# Patient Record
Sex: Male | Born: 1965
Health system: Southern US, Community
[De-identification: ages and names within clinical notes are randomized; demographics above are authoritative.]

## PROBLEM LIST (undated history)

## (undated) DIAGNOSIS — Z86718 Personal history of other venous thrombosis and embolism: Secondary | ICD-10-CM

## (undated) DIAGNOSIS — C801 Malignant (primary) neoplasm, unspecified: Secondary | ICD-10-CM

## (undated) DIAGNOSIS — Z7901 Long term (current) use of anticoagulants: Secondary | ICD-10-CM

## (undated) DIAGNOSIS — N5089 Other specified disorders of the male genital organs: Secondary | ICD-10-CM

## (undated) DIAGNOSIS — D6859 Other primary thrombophilia: Secondary | ICD-10-CM

## (undated) HISTORY — PX: KNEE ARTHROSCOPY W/ ACL RECONSTRUCTION: SHX1858

## (undated) HISTORY — DX: Other primary thrombophilia: D68.59

---

## 2001-05-09 ENCOUNTER — Encounter: Admission: RE | Admit: 2001-05-09 | Discharge: 2001-05-09 | Payer: Self-pay | Admitting: Family Medicine

## 2001-05-09 ENCOUNTER — Encounter: Payer: Self-pay | Admitting: Family Medicine

## 2001-05-09 ENCOUNTER — Inpatient Hospital Stay (HOSPITAL_COMMUNITY): Admission: EM | Admit: 2001-05-09 | Discharge: 2001-05-10 | Payer: Self-pay | Admitting: *Deleted

## 2001-05-14 ENCOUNTER — Encounter: Admission: RE | Admit: 2001-05-14 | Discharge: 2001-05-14 | Payer: Self-pay | Admitting: Internal Medicine

## 2001-05-21 ENCOUNTER — Encounter: Admission: RE | Admit: 2001-05-21 | Discharge: 2001-05-21 | Payer: Self-pay | Admitting: Internal Medicine

## 2001-06-04 ENCOUNTER — Encounter: Admission: RE | Admit: 2001-06-04 | Discharge: 2001-06-04 | Payer: Self-pay | Admitting: Internal Medicine

## 2001-06-06 ENCOUNTER — Ambulatory Visit (HOSPITAL_COMMUNITY): Admission: RE | Admit: 2001-06-06 | Discharge: 2001-06-06 | Payer: Self-pay | Admitting: Internal Medicine

## 2001-06-06 ENCOUNTER — Encounter: Admission: RE | Admit: 2001-06-06 | Discharge: 2001-06-06 | Payer: Self-pay

## 2001-06-11 ENCOUNTER — Encounter: Admission: RE | Admit: 2001-06-11 | Discharge: 2001-06-11 | Payer: Self-pay | Admitting: Internal Medicine

## 2001-06-28 ENCOUNTER — Encounter: Admission: RE | Admit: 2001-06-28 | Discharge: 2001-06-28 | Payer: Self-pay

## 2001-07-16 ENCOUNTER — Encounter: Admission: RE | Admit: 2001-07-16 | Discharge: 2001-07-16 | Payer: Self-pay

## 2001-08-06 ENCOUNTER — Encounter: Admission: RE | Admit: 2001-08-06 | Discharge: 2001-08-06 | Payer: Self-pay | Admitting: Internal Medicine

## 2001-08-27 ENCOUNTER — Encounter: Admission: RE | Admit: 2001-08-27 | Discharge: 2001-08-27 | Payer: Self-pay | Admitting: Internal Medicine

## 2001-10-29 ENCOUNTER — Encounter: Admission: RE | Admit: 2001-10-29 | Discharge: 2001-10-29 | Payer: Self-pay

## 2001-11-26 ENCOUNTER — Encounter: Admission: RE | Admit: 2001-11-26 | Discharge: 2001-11-26 | Payer: Self-pay | Admitting: Internal Medicine

## 2001-12-05 ENCOUNTER — Ambulatory Visit (HOSPITAL_BASED_OUTPATIENT_CLINIC_OR_DEPARTMENT_OTHER): Admission: RE | Admit: 2001-12-05 | Discharge: 2001-12-05 | Payer: Self-pay | Admitting: General Surgery

## 2001-12-05 ENCOUNTER — Encounter (INDEPENDENT_AMBULATORY_CARE_PROVIDER_SITE_OTHER): Payer: Self-pay | Admitting: *Deleted

## 2002-01-16 ENCOUNTER — Encounter: Admission: RE | Admit: 2002-01-16 | Discharge: 2002-01-16 | Payer: Self-pay | Admitting: Internal Medicine

## 2002-01-22 ENCOUNTER — Encounter: Admission: RE | Admit: 2002-01-22 | Discharge: 2002-01-22 | Payer: Self-pay | Admitting: Internal Medicine

## 2002-03-15 ENCOUNTER — Encounter: Admission: RE | Admit: 2002-03-15 | Discharge: 2002-03-15 | Payer: Self-pay | Admitting: Internal Medicine

## 2002-03-25 ENCOUNTER — Encounter: Admission: RE | Admit: 2002-03-25 | Discharge: 2002-03-25 | Payer: Self-pay | Admitting: Internal Medicine

## 2002-04-11 ENCOUNTER — Encounter: Admission: RE | Admit: 2002-04-11 | Discharge: 2002-04-11 | Payer: Self-pay | Admitting: Internal Medicine

## 2002-05-09 ENCOUNTER — Encounter: Admission: RE | Admit: 2002-05-09 | Discharge: 2002-05-09 | Payer: Self-pay | Admitting: Internal Medicine

## 2002-06-03 ENCOUNTER — Encounter: Admission: RE | Admit: 2002-06-03 | Discharge: 2002-06-03 | Payer: Self-pay | Admitting: Internal Medicine

## 2002-07-01 ENCOUNTER — Encounter: Admission: RE | Admit: 2002-07-01 | Discharge: 2002-07-01 | Payer: Self-pay | Admitting: Internal Medicine

## 2002-09-12 ENCOUNTER — Encounter: Admission: RE | Admit: 2002-09-12 | Discharge: 2002-09-12 | Payer: Self-pay | Admitting: Internal Medicine

## 2002-11-07 ENCOUNTER — Encounter: Admission: RE | Admit: 2002-11-07 | Discharge: 2002-11-07 | Payer: Self-pay | Admitting: Internal Medicine

## 2003-02-03 ENCOUNTER — Emergency Department (HOSPITAL_COMMUNITY): Admission: EM | Admit: 2003-02-03 | Discharge: 2003-02-03 | Payer: Self-pay | Admitting: Emergency Medicine

## 2003-06-18 ENCOUNTER — Encounter: Admission: RE | Admit: 2003-06-18 | Discharge: 2003-06-18 | Payer: Self-pay | Admitting: Internal Medicine

## 2003-06-18 ENCOUNTER — Ambulatory Visit (HOSPITAL_COMMUNITY): Admission: RE | Admit: 2003-06-18 | Discharge: 2003-06-18 | Payer: Self-pay | Admitting: Internal Medicine

## 2003-06-18 ENCOUNTER — Encounter: Payer: Self-pay | Admitting: Internal Medicine

## 2003-06-20 ENCOUNTER — Encounter: Admission: RE | Admit: 2003-06-20 | Discharge: 2003-06-20 | Payer: Self-pay | Admitting: Internal Medicine

## 2003-06-26 ENCOUNTER — Encounter: Admission: RE | Admit: 2003-06-26 | Discharge: 2003-06-26 | Payer: Self-pay | Admitting: Internal Medicine

## 2003-07-01 ENCOUNTER — Encounter: Admission: RE | Admit: 2003-07-01 | Discharge: 2003-07-01 | Payer: Self-pay | Admitting: Internal Medicine

## 2003-07-01 ENCOUNTER — Ambulatory Visit (HOSPITAL_COMMUNITY): Admission: RE | Admit: 2003-07-01 | Discharge: 2003-07-01 | Payer: Self-pay | Admitting: Orthopedic Surgery

## 2003-07-07 ENCOUNTER — Encounter: Admission: RE | Admit: 2003-07-07 | Discharge: 2003-07-07 | Payer: Self-pay | Admitting: Internal Medicine

## 2003-07-21 ENCOUNTER — Encounter: Admission: RE | Admit: 2003-07-21 | Discharge: 2003-07-21 | Payer: Self-pay | Admitting: Internal Medicine

## 2003-08-04 ENCOUNTER — Encounter: Admission: RE | Admit: 2003-08-04 | Discharge: 2003-08-04 | Payer: Self-pay | Admitting: Internal Medicine

## 2003-09-11 ENCOUNTER — Encounter: Admission: RE | Admit: 2003-09-11 | Discharge: 2003-09-11 | Payer: Self-pay | Admitting: Internal Medicine

## 2003-10-06 ENCOUNTER — Encounter: Admission: RE | Admit: 2003-10-06 | Discharge: 2003-10-06 | Payer: Self-pay | Admitting: Internal Medicine

## 2003-11-03 ENCOUNTER — Encounter: Admission: RE | Admit: 2003-11-03 | Discharge: 2003-11-03 | Payer: Self-pay | Admitting: Internal Medicine

## 2004-06-14 ENCOUNTER — Ambulatory Visit: Payer: Self-pay | Admitting: Internal Medicine

## 2004-07-12 ENCOUNTER — Ambulatory Visit: Payer: Self-pay | Admitting: Internal Medicine

## 2004-08-16 ENCOUNTER — Ambulatory Visit: Payer: Self-pay | Admitting: Internal Medicine

## 2004-10-18 ENCOUNTER — Ambulatory Visit: Payer: Self-pay | Admitting: Internal Medicine

## 2005-03-28 ENCOUNTER — Ambulatory Visit: Payer: Self-pay | Admitting: Internal Medicine

## 2005-05-23 ENCOUNTER — Ambulatory Visit: Payer: Self-pay | Admitting: Internal Medicine

## 2006-01-08 ENCOUNTER — Emergency Department (HOSPITAL_COMMUNITY): Admission: EM | Admit: 2006-01-08 | Discharge: 2006-01-08 | Payer: Self-pay | Admitting: Emergency Medicine

## 2006-01-12 ENCOUNTER — Ambulatory Visit: Payer: Self-pay | Admitting: Internal Medicine

## 2006-01-26 ENCOUNTER — Ambulatory Visit: Payer: Self-pay | Admitting: Internal Medicine

## 2006-04-24 ENCOUNTER — Ambulatory Visit: Payer: Self-pay | Admitting: Internal Medicine

## 2006-05-22 ENCOUNTER — Ambulatory Visit: Payer: Self-pay | Admitting: Internal Medicine

## 2006-06-26 ENCOUNTER — Ambulatory Visit: Payer: Self-pay | Admitting: Internal Medicine

## 2006-07-31 ENCOUNTER — Ambulatory Visit: Payer: Self-pay | Admitting: Internal Medicine

## 2006-09-11 ENCOUNTER — Ambulatory Visit: Payer: Self-pay | Admitting: Internal Medicine

## 2006-12-25 ENCOUNTER — Ambulatory Visit: Payer: Self-pay | Admitting: Hospitalist

## 2006-12-25 DIAGNOSIS — I82409 Acute embolism and thrombosis of unspecified deep veins of unspecified lower extremity: Secondary | ICD-10-CM | POA: Insufficient documentation

## 2006-12-25 LAB — CONVERTED CEMR LAB: INR: 3.8

## 2007-03-19 ENCOUNTER — Ambulatory Visit: Payer: Self-pay | Admitting: Hematology & Oncology

## 2007-03-26 ENCOUNTER — Ambulatory Visit: Payer: Self-pay | Admitting: *Deleted

## 2007-03-26 ENCOUNTER — Encounter (INDEPENDENT_AMBULATORY_CARE_PROVIDER_SITE_OTHER): Payer: Self-pay | Admitting: Internal Medicine

## 2007-03-26 LAB — CONVERTED CEMR LAB: INR: 2.7

## 2007-06-11 ENCOUNTER — Ambulatory Visit: Payer: Self-pay | Admitting: Hematology & Oncology

## 2007-06-19 LAB — HYPERCOAGULABLE PANEL, COMPREHENSIVE
Anticardiolipin IgA: 8 [APL'U] (ref <13)
Beta-2-Glycoprotein I IgM: <4 U/mL (ref <10)
Protein C Activity: 122 % (ref 75–133)
Protein C, Total: 87 % (ref 70–140)
Protein S Activity: 20 % — ABNORMAL LOW (ref 69–129)

## 2008-01-07 ENCOUNTER — Encounter: Payer: Self-pay | Admitting: Hematology & Oncology

## 2008-01-07 ENCOUNTER — Ambulatory Visit: Payer: Self-pay | Admitting: Surgery

## 2008-01-07 ENCOUNTER — Ambulatory Visit: Payer: Self-pay | Admitting: Hematology & Oncology

## 2008-01-07 ENCOUNTER — Ambulatory Visit (HOSPITAL_COMMUNITY): Admission: RE | Admit: 2008-01-07 | Discharge: 2008-01-07 | Payer: Self-pay | Admitting: Hematology & Oncology

## 2008-01-09 LAB — PROTEIN C ACTIVITY: Protein C Activity: 164 % — ABNORMAL HIGH (ref 75–133)

## 2008-01-09 LAB — PROTEIN C, TOTAL: Protein C, Total: 91 % (ref 70–140)

## 2008-02-13 LAB — PROTIME-INR
INR: 3.1 (ref 2.00–3.50)
Protime: 37.2 s — ABNORMAL HIGH (ref 10.6–13.4)

## 2008-02-21 LAB — PROTIME-INR: INR: 2.7 (ref 2.00–3.50)

## 2008-03-04 ENCOUNTER — Ambulatory Visit: Payer: Self-pay | Admitting: Hematology & Oncology

## 2008-03-06 LAB — PROTIME-INR

## 2008-03-18 ENCOUNTER — Ambulatory Visit: Payer: Self-pay | Admitting: Hematology & Oncology

## 2008-03-20 LAB — PROTIME-INR
INR: 2.7 (ref 2.00–3.50)
Protime: 32.4 Seconds — ABNORMAL HIGH (ref 10.6–13.4)

## 2008-04-03 LAB — PROTIME-INR
INR: 2.6 (ref 2.00–3.50)
Protime: 31.2 Seconds — ABNORMAL HIGH (ref 10.6–13.4)

## 2008-05-09 ENCOUNTER — Ambulatory Visit: Payer: Self-pay | Admitting: Hematology & Oncology

## 2008-05-09 LAB — PROTIME-INR

## 2008-06-11 ENCOUNTER — Ambulatory Visit: Payer: Self-pay | Admitting: Hematology & Oncology

## 2008-06-12 LAB — PROTIME-INR (CHCC SATELLITE): INR: 2.6 (ref 2.0–3.5)

## 2008-07-07 ENCOUNTER — Ambulatory Visit: Payer: Self-pay | Admitting: Hematology & Oncology

## 2008-07-09 LAB — PROTIME-INR: INR: 2.6 (ref 2.00–3.50)

## 2008-08-06 LAB — PROTIME-INR
INR: 2.5 (ref 2.00–3.50)
Protime: 30 Seconds — ABNORMAL HIGH (ref 10.6–13.4)

## 2008-09-01 ENCOUNTER — Ambulatory Visit: Payer: Self-pay | Admitting: Hematology & Oncology

## 2008-09-03 LAB — PROTIME-INR: Protime: 30 Seconds — ABNORMAL HIGH (ref 10.6–13.4)

## 2008-09-30 ENCOUNTER — Ambulatory Visit: Payer: Self-pay | Admitting: Hematology & Oncology

## 2008-10-01 LAB — PROTIME-INR (CHCC SATELLITE)

## 2008-10-27 ENCOUNTER — Ambulatory Visit: Payer: Self-pay | Admitting: Hematology & Oncology

## 2008-10-29 LAB — PROTIME-INR
INR: 3.1 (ref 2.00–3.50)
Protime: 37.2 Seconds — ABNORMAL HIGH (ref 10.6–13.4)

## 2008-12-22 ENCOUNTER — Ambulatory Visit: Payer: Self-pay | Admitting: Hematology & Oncology

## 2008-12-24 LAB — PROTIME-INR
INR: 2.9 (ref 2.00–3.50)
Protime: 34.8 Seconds — ABNORMAL HIGH (ref 10.6–13.4)

## 2009-01-26 LAB — PROTIME-INR: Protime: 34.8 Seconds — ABNORMAL HIGH (ref 10.6–13.4)

## 2009-01-27 ENCOUNTER — Ambulatory Visit: Payer: Self-pay | Admitting: Hematology & Oncology

## 2009-03-02 ENCOUNTER — Ambulatory Visit: Payer: Self-pay | Admitting: Hematology & Oncology

## 2009-04-06 ENCOUNTER — Ambulatory Visit: Payer: Self-pay | Admitting: Hematology & Oncology

## 2009-04-16 LAB — PROTIME-INR: Protime: 36 Seconds — ABNORMAL HIGH (ref 10.6–13.4)

## 2009-05-12 ENCOUNTER — Ambulatory Visit: Payer: Self-pay | Admitting: Hematology & Oncology

## 2009-05-13 LAB — PROTIME-INR (CHCC SATELLITE): Protime: 39.6 Seconds — ABNORMAL HIGH (ref 10.6–13.4)

## 2009-06-15 ENCOUNTER — Ambulatory Visit: Payer: Self-pay | Admitting: Hematology & Oncology

## 2009-06-30 LAB — PROTIME-INR

## 2009-07-20 ENCOUNTER — Ambulatory Visit: Payer: Self-pay | Admitting: Hematology

## 2009-07-22 LAB — PROTIME-INR: INR: 3.1 (ref 2.00–3.50)

## 2009-08-25 ENCOUNTER — Ambulatory Visit: Payer: Self-pay | Admitting: Hematology & Oncology

## 2009-08-26 LAB — CBC WITH DIFFERENTIAL (CANCER CENTER ONLY)
BASO%: 0.6 % (ref 0.0–2.0)
EOS%: 2.8 % (ref 0.0–7.0)
LYMPH%: 36.1 % (ref 14.0–48.0)
MCH: 30 pg (ref 28.0–33.4)
MCHC: 35.1 g/dL (ref 32.0–35.9)
MCV: 86 fL (ref 82–98)
MONO%: 8.3 % (ref 0.0–13.0)
Platelets: 211 10*3/uL (ref 145–400)
RDW: 12.2 % (ref 10.5–14.6)
WBC: 5.1 10*3/uL (ref 4.0–10.0)

## 2009-08-26 LAB — PROTIME-INR (CHCC SATELLITE)
INR: 2.5 (ref 2.0–3.5)
Protime: 30 s — ABNORMAL HIGH (ref 10.6–13.4)

## 2009-10-12 ENCOUNTER — Ambulatory Visit: Payer: Self-pay | Admitting: Hematology

## 2009-11-02 LAB — PROTIME-INR: INR: 2.6 (ref 2.00–3.50)

## 2009-12-16 ENCOUNTER — Encounter: Admission: RE | Admit: 2009-12-16 | Discharge: 2009-12-16 | Payer: Self-pay | Admitting: Family Medicine

## 2009-12-22 ENCOUNTER — Ambulatory Visit: Payer: Self-pay | Admitting: Hematology & Oncology

## 2009-12-30 LAB — CBC WITH DIFFERENTIAL (CANCER CENTER ONLY)
BASO#: 0 10*3/uL (ref 0.0–0.2)
BASO%: 0.6 % (ref 0.0–2.0)
HCT: 44 % (ref 38.7–49.9)
HGB: 15 g/dL (ref 13.0–17.1)
LYMPH#: 2.2 10*3/uL (ref 0.9–3.3)
LYMPH%: 34.6 % (ref 14.0–48.0)
MCHC: 34.1 g/dL (ref 32.0–35.9)
MCV: 88 fL (ref 82–98)
MONO#: 0.4 10*3/uL (ref 0.1–0.9)
NEUT%: 56.3 % (ref 40.0–80.0)
RDW: 11.7 % (ref 10.5–14.6)
WBC: 6.2 10*3/uL (ref 4.0–10.0)

## 2009-12-30 LAB — D-DIMER, QUANTITATIVE: D-Dimer, Quant: 0.22 ug/mL-FEU (ref 0.00–0.48)

## 2009-12-30 LAB — PROTIME-INR (CHCC SATELLITE)

## 2010-05-04 ENCOUNTER — Ambulatory Visit: Payer: Self-pay | Admitting: Hematology & Oncology

## 2010-05-05 LAB — CBC WITH DIFFERENTIAL (CANCER CENTER ONLY)
BASO#: 0.1 10*3/uL (ref 0.0–0.2)
BASO%: 1.5 % (ref 0.0–2.0)
EOS%: 2.6 % (ref 0.0–7.0)
Eosinophils Absolute: 0.2 10*3/uL (ref 0.0–0.5)
HCT: 45.5 % (ref 38.7–49.9)
HGB: 15.4 g/dL (ref 13.0–17.1)
LYMPH#: 2.2 10*3/uL (ref 0.9–3.3)
LYMPH%: 37 % (ref 14.0–48.0)
MCH: 29.9 pg (ref 28.0–33.4)
MCHC: 33.8 g/dL (ref 32.0–35.9)
MCV: 89 fL (ref 82–98)
MONO#: 0.4 10*3/uL (ref 0.1–0.9)
MONO%: 7.1 % (ref 0.0–13.0)
NEUT#: 3.1 10*3/uL (ref 1.5–6.5)
NEUT%: 51.8 % (ref 40.0–80.0)
Platelets: 236 10*3/uL (ref 145–400)
RBC: 5.13 10*6/uL (ref 4.20–5.70)
RDW: 11.9 % (ref 10.5–14.6)
WBC: 6 10*3/uL (ref 4.0–10.0)

## 2010-05-05 LAB — TECHNOLOGIST REVIEW CHCC SATELLITE: Tech Review: 2

## 2010-05-05 LAB — PROTIME-INR (CHCC SATELLITE)
INR: 3 (ref 2.0–3.5)
Protime: 36 Seconds — ABNORMAL HIGH (ref 10.6–13.4)

## 2010-06-15 ENCOUNTER — Ambulatory Visit: Payer: Self-pay | Admitting: Oncology

## 2010-07-26 ENCOUNTER — Ambulatory Visit: Payer: Self-pay | Admitting: Oncology

## 2010-07-26 LAB — PROTIME-INR: Protime: 25.2 Seconds — ABNORMAL HIGH (ref 10.6–13.4)

## 2010-09-15 ENCOUNTER — Ambulatory Visit (HOSPITAL_BASED_OUTPATIENT_CLINIC_OR_DEPARTMENT_OTHER): Payer: BC Managed Care – PPO | Admitting: Hematology & Oncology

## 2010-11-03 ENCOUNTER — Encounter (HOSPITAL_BASED_OUTPATIENT_CLINIC_OR_DEPARTMENT_OTHER): Payer: BC Managed Care – PPO | Admitting: Hematology & Oncology

## 2010-11-03 DIAGNOSIS — D6859 Other primary thrombophilia: Secondary | ICD-10-CM

## 2010-11-03 DIAGNOSIS — Z7901 Long term (current) use of anticoagulants: Secondary | ICD-10-CM

## 2010-11-03 DIAGNOSIS — Z86718 Personal history of other venous thrombosis and embolism: Secondary | ICD-10-CM

## 2010-11-03 LAB — CBC WITH DIFFERENTIAL (CANCER CENTER ONLY)
BASO#: 0 10*3/uL (ref 0.0–0.2)
Eosinophils Absolute: 0.1 10*3/uL (ref 0.0–0.5)
HCT: 42.8 % (ref 38.7–49.9)
HGB: 14.9 g/dL (ref 13.0–17.1)
LYMPH%: 39.3 % (ref 14.0–48.0)
MCH: 30.5 pg (ref 28.0–33.4)
MCV: 88 fL (ref 82–98)
MONO#: 0.4 10*3/uL (ref 0.1–0.9)
MONO%: 7.8 % (ref 0.0–13.0)
NEUT%: 50.6 % (ref 40.0–80.0)
Platelets: 216 10*3/uL (ref 145–400)
RBC: 4.88 10*6/uL (ref 4.20–5.70)
WBC: 5.4 10*3/uL (ref 4.0–10.0)

## 2010-11-04 LAB — D-DIMER, QUANTITATIVE: D-Dimer, Quant: 0.22 ug/mL-FEU (ref 0.00–0.48)

## 2011-01-18 ENCOUNTER — Encounter (HOSPITAL_BASED_OUTPATIENT_CLINIC_OR_DEPARTMENT_OTHER): Payer: BC Managed Care – PPO | Admitting: Oncology

## 2011-01-18 ENCOUNTER — Other Ambulatory Visit: Payer: Self-pay | Admitting: Hematology & Oncology

## 2011-01-18 DIAGNOSIS — Z86718 Personal history of other venous thrombosis and embolism: Secondary | ICD-10-CM

## 2011-01-21 NOTE — Op Note (Signed)
. [facility]  Patient:    Connor Howell, Connor Howell Visit Number: [id number] MRN: [id number]          Service Type: DSU Location: [facility] Attending Physician:  Caleen Essex Dictated by:   Ollen Gross. Vernell Morgans, M.D. Proc. Date: 12/05/01 Admit Date:  12/05/2001 Discharge Date: 12/05/2001                             Operative Report  PREOPERATIVE DIAGNOSIS:  Sebaceous cyst on the back.  POSTOPERATIVE DIAGNOSIS:  Sebaceous cyst on the back.  PROCEDURE:  Excision of a 1 cm sebaceous cyst of the back.  SURGEON:  Ollen Gross. Vernell Morgans, M.D.  ANESTHESIA:  Local.  DESCRIPTION OF PROCEDURE:  After informed consent was obtained, the patient was brought to the operating room, and placed in the prone position on the operating room table.  The area in question was prepped with Betadine and draped in the usual sterile manner.  The area around the mass in question was infiltrated with 1% lidocaine with epinephrine, and this was massaged into the tissue for several minutes.  A transversely oriented elliptical incision was made over top of the mass, and this incision was carried down through into the subcutaneous tissue sharply with the 15 blade knife.  Metzenbaum scissors were then used to sharply separate the mass from the rest of the subcutaneous tissue.  The mass was then removed and sent to the pathologist for further evaluation.  The wound was then examined and found to be hemostatic.  The incision was then closed with interrupted 3-0 nylon vertical mattress sutures. Neosporin and sterile dressings were applied.  The patient tolerated the procedure well.  At the end of the case, all needle, sponge, and instrument counts were correct.  The patient was then taken to the recovery room in stable condition. Dictated by:   Ollen Gross. Vernell Morgans, M.D. Attending Physician:  Caleen Essex DD:  12/08/01 TD:  12/10/01 Job: [id number]

## 2011-01-27 ENCOUNTER — Other Ambulatory Visit: Payer: Self-pay | Admitting: Hematology & Oncology

## 2011-01-27 ENCOUNTER — Encounter (HOSPITAL_BASED_OUTPATIENT_CLINIC_OR_DEPARTMENT_OTHER): Payer: BC Managed Care – PPO | Admitting: Oncology

## 2011-01-27 DIAGNOSIS — Z7901 Long term (current) use of anticoagulants: Secondary | ICD-10-CM

## 2011-01-27 DIAGNOSIS — D6859 Other primary thrombophilia: Secondary | ICD-10-CM

## 2011-01-27 DIAGNOSIS — Z86718 Personal history of other venous thrombosis and embolism: Secondary | ICD-10-CM

## 2011-01-27 LAB — PROTIME-INR

## 2011-04-27 ENCOUNTER — Other Ambulatory Visit: Payer: Self-pay | Admitting: Hematology & Oncology

## 2011-04-27 ENCOUNTER — Encounter (HOSPITAL_BASED_OUTPATIENT_CLINIC_OR_DEPARTMENT_OTHER): Payer: BC Managed Care – PPO | Admitting: Hematology & Oncology

## 2011-04-27 DIAGNOSIS — Z7901 Long term (current) use of anticoagulants: Secondary | ICD-10-CM

## 2011-04-27 DIAGNOSIS — D6859 Other primary thrombophilia: Secondary | ICD-10-CM

## 2011-04-27 DIAGNOSIS — Z86718 Personal history of other venous thrombosis and embolism: Secondary | ICD-10-CM

## 2011-04-27 LAB — CBC WITH DIFFERENTIAL (CANCER CENTER ONLY)
BASO%: 0.2 % (ref 0.0–2.0)
Eosinophils Absolute: 0.1 10*3/uL (ref 0.0–0.5)
HCT: 42.7 % (ref 38.7–49.9)
LYMPH#: 1.9 10*3/uL (ref 0.9–3.3)
LYMPH%: 32.2 % (ref 14.0–48.0)
MCV: 84 fL (ref 82–98)
MONO#: 0.5 10*3/uL (ref 0.1–0.9)
NEUT%: 57.4 % (ref 40.0–80.0)
RBC: 5.07 10*6/uL (ref 4.20–5.70)
RDW: 12.7 % (ref 11.1–15.7)
WBC: 6 10*3/uL (ref 4.0–10.0)

## 2011-04-27 LAB — PROTIME-INR (CHCC SATELLITE): Protime: 24 Seconds — ABNORMAL HIGH (ref 10.6–13.4)

## 2011-04-28 LAB — D-DIMER, QUANTITATIVE: D-Dimer, Quant: 0.23 ug/mL-FEU (ref 0.00–0.48)

## 2011-07-13 ENCOUNTER — Other Ambulatory Visit (HOSPITAL_BASED_OUTPATIENT_CLINIC_OR_DEPARTMENT_OTHER): Payer: BC Managed Care – PPO | Admitting: Lab

## 2011-07-13 ENCOUNTER — Other Ambulatory Visit: Payer: Self-pay | Admitting: Hematology & Oncology

## 2011-07-13 DIAGNOSIS — Z7901 Long term (current) use of anticoagulants: Secondary | ICD-10-CM

## 2011-07-13 DIAGNOSIS — D6859 Other primary thrombophilia: Secondary | ICD-10-CM

## 2011-07-13 DIAGNOSIS — Z86718 Personal history of other venous thrombosis and embolism: Secondary | ICD-10-CM

## 2011-07-13 LAB — PROTIME-INR
INR: 1.8 — ABNORMAL LOW (ref 2.00–3.50)
Protime: 21.6 Seconds — ABNORMAL HIGH (ref 10.6–13.4)

## 2011-10-17 ENCOUNTER — Other Ambulatory Visit: Payer: Self-pay | Admitting: Hematology & Oncology

## 2011-10-19 ENCOUNTER — Encounter: Payer: Self-pay | Admitting: Hematology & Oncology

## 2011-10-19 ENCOUNTER — Ambulatory Visit (HOSPITAL_BASED_OUTPATIENT_CLINIC_OR_DEPARTMENT_OTHER): Payer: 59 | Admitting: Hematology & Oncology

## 2011-10-19 ENCOUNTER — Other Ambulatory Visit (HOSPITAL_BASED_OUTPATIENT_CLINIC_OR_DEPARTMENT_OTHER): Payer: 59 | Admitting: Lab

## 2011-10-19 DIAGNOSIS — D6859 Other primary thrombophilia: Secondary | ICD-10-CM

## 2011-10-19 DIAGNOSIS — I82409 Acute embolism and thrombosis of unspecified deep veins of unspecified lower extremity: Secondary | ICD-10-CM

## 2011-10-19 DIAGNOSIS — Z7901 Long term (current) use of anticoagulants: Secondary | ICD-10-CM

## 2011-10-19 LAB — CBC WITH DIFFERENTIAL (CANCER CENTER ONLY)
BASO#: 0 10*3/uL (ref 0.0–0.2)
EOS%: 1.4 % (ref 0.0–7.0)
Eosinophils Absolute: 0.1 10*3/uL (ref 0.0–0.5)
HCT: 43.4 % (ref 38.7–49.9)
HGB: 15.1 g/dL (ref 13.0–17.1)
LYMPH#: 2.1 10*3/uL (ref 0.9–3.3)
MCHC: 34.8 g/dL (ref 32.0–35.9)
MONO#: 0.5 10*3/uL (ref 0.1–0.9)
NEUT%: 53.5 % (ref 40.0–80.0)
RBC: 4.97 10*6/uL (ref 4.20–5.70)

## 2011-10-19 LAB — PROTIME-INR (CHCC SATELLITE)

## 2011-10-19 NOTE — Progress Notes (Signed)
This office note has been dictated.

## 2011-10-20 NOTE — Progress Notes (Signed)
CC:   Kandyce Rud, MD  DIAGNOSIS: 1. Recurrent deep vein thrombosis of the right leg. 2. Protein S deficiency.  CURRENT THERAPY:  Coumadin to maintain INR of around 2.  INTERIM HISTORY:  Connor Howell comes in for his followup.  He is really doing well.  He has had no complaints at all.  He has had no nausea and vomiting.  He has had no bleeding or bruising.  There has been no leg pain.  He has not noticed any leg swelling.  He is working without any difficulties.  PHYSICAL EXAMINATION:  General:  This is a well-developed, well- nourished white gentleman in no obvious distress.  Vital Signs:  Show a temperature of 97.1, pulse 68, respiratory rate 14, blood pressure is 148/75.  Head and Neck Exam:  Shows a normocephalic, atraumatic skull. There are no ocular or oral lesions.  There are no palpable cervical or supraclavicular lymph nodes.  Lungs:  Clear to percussion and auscultation bilaterally.  Cardiac Exam:  Regular rate and rhythm with normal S1 and S2.  There are no murmurs, rubs or bruits.  Abdominal Exam:  Soft with good bowel sounds.  There is no palpable abdominal mass.  There is no fluid wave.  There is no palpable hepatosplenomegaly. Extremities:  Show no clubbing, cyanosis, or edema.  No palpable venous cord is noted in the legs.  Skin Exam:  No rashes, ecchymosis or petechiae.  Neurological Exam:  Shows no focal neurological deficits.  LABORATORY STUDIES:  Show a white cell count of 5.7 hemoglobin 15, hematocrit 43, platelet count 194.  INR is 1.8.  IMPRESSION:  Connor Howell is a 46 year old white gentleman who has protein S deficiency.  He has recurrent deep vein thrombosis of the right leg. He is doing well.  By his last Doppler, which was probably a year or so ago, he did not have any issues with respect to residual/recurrent deep vein thrombosis.  I think we can get him back in 6 months again.  Given the fact that his INR is relatively stable, I just do not see  that we have to put him through any testing in between visits.  I told him that he could certainly come back to see [place] if he did have problems.    ______________________________ Josph Macho, M.D. PRE/MEDQ  D:  10/19/2011  T:  10/20/2011  Job:  [id number]

## 2012-04-18 ENCOUNTER — Ambulatory Visit: Payer: 59 | Admitting: Hematology & Oncology

## 2012-04-18 ENCOUNTER — Other Ambulatory Visit: Payer: 59 | Admitting: Lab

## 2012-05-10 ENCOUNTER — Other Ambulatory Visit: Payer: 59 | Admitting: Lab

## 2012-05-10 ENCOUNTER — Ambulatory Visit: Payer: 59 | Admitting: Hematology & Oncology

## 2012-05-24 ENCOUNTER — Ambulatory Visit (HOSPITAL_BASED_OUTPATIENT_CLINIC_OR_DEPARTMENT_OTHER): Payer: 59 | Admitting: Hematology & Oncology

## 2012-05-24 ENCOUNTER — Other Ambulatory Visit (HOSPITAL_BASED_OUTPATIENT_CLINIC_OR_DEPARTMENT_OTHER): Payer: 59 | Admitting: Lab

## 2012-05-24 VITALS — BP 115/62 | HR 65 | Temp 97.7°F | Resp 20 | Ht 71.0 in | Wt 211.0 lb

## 2012-05-24 DIAGNOSIS — D6859 Other primary thrombophilia: Secondary | ICD-10-CM

## 2012-05-24 DIAGNOSIS — I82409 Acute embolism and thrombosis of unspecified deep veins of unspecified lower extremity: Secondary | ICD-10-CM

## 2012-05-24 DIAGNOSIS — Z7901 Long term (current) use of anticoagulants: Secondary | ICD-10-CM

## 2012-05-24 LAB — CBC WITH DIFFERENTIAL (CANCER CENTER ONLY)
BASO%: 0.4 % (ref 0.0–2.0)
EOS%: 1.1 % (ref 0.0–7.0)
HCT: 42.6 % (ref 38.7–49.9)
LYMPH#: 2 10*3/uL (ref 0.9–3.3)
MONO#: 0.5 10*3/uL (ref 0.1–0.9)
NEUT#: 3.2 10*3/uL (ref 1.5–6.5)
NEUT%: 55.6 % (ref 40.0–80.0)
Platelets: 189 10*3/uL (ref 145–400)
RDW: 12.9 % (ref 11.1–15.7)
WBC: 5.7 10*3/uL (ref 4.0–10.0)

## 2012-05-24 LAB — PROTIME-INR (CHCC SATELLITE)
INR: 2 (ref 2.0–3.5)
Protime: 24 Seconds — ABNORMAL HIGH (ref 10.6–13.4)

## 2012-05-24 LAB — D-DIMER, QUANTITATIVE: D-Dimer, Quant: 0.27 ug/mL-FEU (ref 0.00–0.48)

## 2012-05-24 NOTE — Progress Notes (Signed)
This office note has been dictated.

## 2012-05-24 NOTE — Patient Instructions (Signed)
Call if bleeding

## 2012-05-25 NOTE — Progress Notes (Signed)
CC:   Connor Howell, M.D.  DIAGNOSES: 1. Recurrent deep vein thrombosis of the right leg. 2. Protein S deficiency.  CURRENT THERAPY:  Coumadin, lifelong, to keep INR around 2.  INTERIM HISTORY:  Connor Howell comes in for his followup.  He is doing great.  He is working without any difficulties.  His right leg is not bothering him that much.  He did have a little bit of a skin infection on his I think left arm. He was off of Coumadin for a little bit.  He has had no problems with bowels or bladder.  There is no cough or shortness of breath.  He has had no headache.  He has had no rashes.  PHYSICAL EXAMINATION:  This is a well-developed, well-nourished white gentleman in no obvious distress.  Vital signs:  Temperature of 97.7, pulse 65, respiratory rate 18, blood pressure 115/62.  Weight is 211. Head and neck:  Normocephalic, atraumatic skull.  There are no ocular or oral lesions.  There are no palpable cervical or supraclavicular lymph nodes.  Lungs:  Clear to percussion and auscultation bilaterally. Cardiac:  Regular rate and rhythm with normal S1 and S2.  There are no murmurs, rubs or bruits.  Abdomen:  Soft with good bowel sounds.  There is no palpable abdominal mass.  There is no palpable hepatosplenomegaly. Back:  No tenderness over the spine, ribs, or hips.  Extremities:  Some slight nonpitting edema of the right leg.  He has good pulses in his distal extremities.  He has good range of motion of his joints.  No palpable venous cord is noted in his legs.  Skin:  No rashes, ecchymoses or petechia.  Neurologic:  No focal neurological deficits.  LABORATORY STUDIES:  White cell count is 5.7, hemoglobin 14.7, hematocrit 42.6, platelet count 189.  IMPRESSION:  Connor Howell is a 46 year old gentleman with a recurrent deep vein thrombosis of the right leg.  He does have protein S deficiency.  He is on Coumadin.  He is on lifelong Coumadin.  He has had no difficulties with  this.  We see him every 6 months.  I do not see that we need to make this any different right now.  I will plan to see him back in 6 months.  No need for lab work in between visits.   ______________________________ Josph Macho, M.D. PRE/MEDQ  D:  05/24/2012  T:  05/25/2012  Job:  [id number]

## 2012-06-09 ENCOUNTER — Other Ambulatory Visit: Payer: Self-pay | Admitting: Hematology & Oncology

## 2012-06-12 ENCOUNTER — Other Ambulatory Visit: Payer: Self-pay | Admitting: Hematology & Oncology

## 2012-10-23 ENCOUNTER — Telehealth: Payer: Self-pay | Admitting: Hematology & Oncology

## 2012-10-23 NOTE — Telephone Encounter (Signed)
Per MD to cx 11/21/12 apt and resch.  Apt was resch to 11/28/12.  i called patient and gave resch apt date/time.  Patient is aware apt

## 2012-11-21 ENCOUNTER — Other Ambulatory Visit: Payer: 59 | Admitting: Lab

## 2012-11-21 ENCOUNTER — Ambulatory Visit: Payer: 59 | Admitting: Hematology & Oncology

## 2012-11-27 ENCOUNTER — Ambulatory Visit: Payer: 59 | Admitting: Hematology & Oncology

## 2012-11-27 ENCOUNTER — Ambulatory Visit (HOSPITAL_BASED_OUTPATIENT_CLINIC_OR_DEPARTMENT_OTHER): Payer: 59 | Admitting: Medical

## 2012-11-27 ENCOUNTER — Other Ambulatory Visit: Payer: 59 | Admitting: Lab

## 2012-11-27 ENCOUNTER — Other Ambulatory Visit (HOSPITAL_BASED_OUTPATIENT_CLINIC_OR_DEPARTMENT_OTHER): Payer: 59 | Admitting: Lab

## 2012-11-27 VITALS — BP 119/63 | HR 57 | Temp 97.8°F | Resp 18 | Ht 71.0 in | Wt 206.0 lb

## 2012-11-27 DIAGNOSIS — Z86718 Personal history of other venous thrombosis and embolism: Secondary | ICD-10-CM

## 2012-11-27 DIAGNOSIS — I82409 Acute embolism and thrombosis of unspecified deep veins of unspecified lower extremity: Secondary | ICD-10-CM

## 2012-11-27 DIAGNOSIS — D6859 Other primary thrombophilia: Secondary | ICD-10-CM

## 2012-11-27 DIAGNOSIS — Z7901 Long term (current) use of anticoagulants: Secondary | ICD-10-CM

## 2012-11-27 LAB — CBC WITH DIFFERENTIAL (CANCER CENTER ONLY)
BASO#: 0 10*3/uL (ref 0.0–0.2)
Eosinophils Absolute: 0.1 10*3/uL (ref 0.0–0.5)
HCT: 42 % (ref 38.7–49.9)
HGB: 14.3 g/dL (ref 13.0–17.1)
LYMPH#: 1.9 10*3/uL (ref 0.9–3.3)
MCHC: 34 g/dL (ref 32.0–35.9)
MONO#: 0.3 10*3/uL (ref 0.1–0.9)
NEUT%: 56.1 % (ref 40.0–80.0)
RBC: 4.8 10*6/uL (ref 4.20–5.70)
WBC: 5.2 10*3/uL (ref 4.0–10.0)

## 2012-11-27 LAB — PROTIME-INR (CHCC SATELLITE): INR: 1.7 — ABNORMAL LOW (ref 2.0–3.5)

## 2012-11-27 LAB — D-DIMER, QUANTITATIVE: D-Dimer, Quant: 0.27 ug/mL-FEU (ref 0.00–0.48)

## 2012-11-27 NOTE — Progress Notes (Signed)
DIAGNOSES: 1. Recurrent deep vein thrombosis of the right leg. 2. Protein S deficiency.  CURRENT THERAPY:  Coumadin, lifelong, to keep INR around 2.  INTERIM HISTORY:  Connor Howell presents today for an office followup visit.  Overall, he, reports, that he's doing good.  He remains on Coumadin to half milligrams daily.  He's not reported any problems.  He does not report any bleeding complications.  His INR is a bit on the low side.  Today.  His INR is 1.7.  He states, that he got busy and thinks, that he's missed a couple doses.  He will go ahead and take 5 mg dose today.  I would like to bring him back next week to make sure.  His INR is therapeutic.  He's not reporting any lower leg, swelling.  He's not reporting any chest pain, or shortness of breath.  He, reports, a good appetite.  He denies any nausea, vomiting, diarrhea, constipation, chest pain, shortness of breath, or cough.  He denies any fevers, chills, or night sweats.  He denies any type of abdominal pain.  He denies any lower leg swelling.  He denies any obvious, or abnormal bleeding.  He denies any headaches, visual changes, or rashes.  Review of Systems: Constitutional:Negative for malaise/fatigue, fever, chills, weight loss, diaphoresis, activity change, appetite change, and unexpected weight change.  HEENT: Negative for double vision, blurred vision, visual loss, ear pain, tinnitus, congestion, rhinorrhea, epistaxis sore throat or sinus disease, oral pain/lesion, tongue soreness Respiratory: Negative for cough, chest tightness, shortness of breath, wheezing and stridor.  Cardiovascular: Negative for chest pain, palpitations, leg swelling, orthopnea, PND, DOE or claudication Gastrointestinal: Negative for nausea, vomiting, abdominal pain, diarrhea, constipation, blood in stool, melena, hematochezia, abdominal distention, anal bleeding, rectal pain, anorexia and hematemesis.  Genitourinary: Negative for dysuria, frequency, hematuria,   Musculoskeletal: Negative for myalgias, back pain, joint swelling, arthralgias and gait problem.  Skin: Negative for rash, color change, pallor and wound.  Neurological:. Negative for dizziness/light-headedness, tremors, seizures, syncope, facial asymmetry, speech difficulty, weakness, numbness, headaches and paresthesias.  Hematological: Negative for adenopathy. Does not bruise/bleed easily.  Psychiatric/Behavioral:  Negative for depression, no loss of interest in normal activity or change in sleep pattern.   Physical Exam: This is a pleasant, 47 year old, well-developed, well-nourished, white gentleman, in no obvious distress Vitals: Temperature 97.8 degrees, pulse 57, respirations 18, blood pressure 119/63, weight 206 pounds HEENT reveals a normocephalic, atraumatic skull, no scleral icterus, no oral lesions  Neck is supple without any cervical or supraclavicular adenopathy.  Lungs are clear to auscultation bilaterally. There are no wheezes, rales or rhonci Cardiac is regular rate and rhythm with a normal S1 and S2. There are no murmurs, rubs, or bruits.  Abdomen is soft with good bowel sounds, there is no palpable mass. There is no palpable hepatosplenomegaly. There is no palpable fluid wave.  Musculoskeletal no tenderness of the spine, ribs, or hips.  Extremities there are no clubbing, cyanosis, or edema.  Skin no petechia, purpura or ecchymosis Neurologic is nonfocal.  Laboratory Data: White count 5.2, hemoglobin 14.3, hematocrit 42.0, platelets 206,000 INR 1.7  Current Outpatient Prescriptions on File Prior to Visit  Medication Sig Dispense Refill  . busPIRone (BUSPAR) 7.5 MG tablet Take 7.5 mg by mouth 2 (two) times daily.      . Multiple Vitamin (MULTIVITAMIN) tablet Take 1 tablet by mouth daily.      Marland Kitchen warfarin (COUMADIN) 5 MG tablet       . warfarin (COUMADIN) 5  MG tablet TAKE 1 TABLET EVERY DAY  30 tablet  3  . warfarin (COUMADIN) 5 MG tablet TAKE 1 TABLET EVERY DAY  30  tablet  3   No current facility-administered medications on file prior to visit.   Assessment/Plan: This is a pleasant, 47 year old general with the following issues:  #1.  Recurrent deep vein thrombosis of the right leg.  He does have protein S, deficiency.  He is on Coumadin.  He is on lifelong Coumadin.  He's had no difficulties with this.  He is subtherapeutic today.  He will go ahead and make a 5 mg dose today.  We will have him come back next week, and recheck his INR.  Or 2.  Followup.  Connor Howell will follow back up with [place] in 6 months, but before then should there be questions or concerns.

## 2012-11-28 ENCOUNTER — Ambulatory Visit: Payer: 59 | Admitting: Hematology & Oncology

## 2012-11-28 ENCOUNTER — Other Ambulatory Visit: Payer: 59 | Admitting: Lab

## 2012-12-06 ENCOUNTER — Other Ambulatory Visit (HOSPITAL_BASED_OUTPATIENT_CLINIC_OR_DEPARTMENT_OTHER): Payer: 59

## 2012-12-06 DIAGNOSIS — I82401 Acute embolism and thrombosis of unspecified deep veins of right lower extremity: Secondary | ICD-10-CM

## 2012-12-06 DIAGNOSIS — I82409 Acute embolism and thrombosis of unspecified deep veins of unspecified lower extremity: Secondary | ICD-10-CM

## 2012-12-06 LAB — PROTIME-INR
INR: 1.8 — ABNORMAL LOW (ref 2.00–3.50)
Protime: 21.6 Seconds — ABNORMAL HIGH (ref 10.6–13.4)

## 2013-02-05 ENCOUNTER — Other Ambulatory Visit: Payer: Self-pay | Admitting: Hematology & Oncology

## 2013-05-30 ENCOUNTER — Ambulatory Visit (HOSPITAL_BASED_OUTPATIENT_CLINIC_OR_DEPARTMENT_OTHER): Payer: 59 | Admitting: Lab

## 2013-05-30 ENCOUNTER — Ambulatory Visit (HOSPITAL_BASED_OUTPATIENT_CLINIC_OR_DEPARTMENT_OTHER): Payer: 59 | Admitting: Hematology & Oncology

## 2013-05-30 DIAGNOSIS — I82409 Acute embolism and thrombosis of unspecified deep veins of unspecified lower extremity: Secondary | ICD-10-CM

## 2013-05-30 DIAGNOSIS — I82401 Acute embolism and thrombosis of unspecified deep veins of right lower extremity: Secondary | ICD-10-CM

## 2013-05-30 DIAGNOSIS — D6859 Other primary thrombophilia: Secondary | ICD-10-CM

## 2013-05-30 LAB — CBC WITH DIFFERENTIAL (CANCER CENTER ONLY)
BASO%: 0.3 % (ref 0.0–2.0)
HCT: 43.5 % (ref 38.7–49.9)
LYMPH%: 34 % (ref 14.0–48.0)
MCH: 29.9 pg (ref 28.0–33.4)
MCHC: 34.3 g/dL (ref 32.0–35.9)
MCV: 87 fL (ref 82–98)
MONO#: 0.5 10*3/uL (ref 0.1–0.9)
NEUT%: 57.3 % (ref 40.0–80.0)
RDW: 12.5 % (ref 11.1–15.7)
WBC: 6.1 10*3/uL (ref 4.0–10.0)

## 2013-05-30 LAB — D-DIMER, QUANTITATIVE: D-Dimer, Quant: 0.27 ug/mL-FEU (ref 0.00–0.48)

## 2013-05-30 NOTE — Progress Notes (Signed)
This office note has been dictated.

## 2013-06-04 NOTE — Progress Notes (Signed)
CC:   [facility], Fax [phone]  DIAGNOSES: 1. Recurrent deep venous thrombosis of the right leg. 2. Protein S deficiency.  CURRENT THERAPY:  Coumadin 2.5 mg p.o. daily.  INTERIM HISTORY:  [name] comes in for his followup.  He is doing fairly well.  He is still working for [employer].  He has been pretty busy.  He has had no problems with pain in the right leg.  He says the right leg does get swollen every now and then when he is standing on it.  He has had no problems with his left leg.  There have been no bowel or bladder issues.  He has had no bleeding. There has been no cough or shortness breath.  He has had no headache. He has had no rashes.  PHYSICAL EXAMINATION:  General:  This is a well-developed, well- nourished white gentleman in no obvious distress.  Vital signs: Temperature of 98, pulse 68, respiratory rate 18, blood pressure 123/72. Weight is 207 pounds.  Head and neck:  Normocephalic, atraumatic skull. There are no ocular or oral lesions.  There are no palpable cervical or supraclavicular lymph nodes.  Lungs:  Clear to percussion and auscultation bilaterally.  Cardiac:  Regular rate and rhythm with a normal S1 and S2.  There are no murmurs, rubs, or bruits.  Abdomen: Soft.  He has good bowel sounds.  There is no fluid wave.  There is no palpable hepatosplenomegaly.  Extremities:  No clubbing, cyanosis, or edema.  There may be some slight nonpitting edema of the right lower leg.  He has no venous cord in the right leg.  There is a negative Homans sign with the right leg.  Left leg is unremarkable.  Skin:  No rashes, ecchymosis, or petechia.  Neurological.  No focal neurological deficits.  LABORATORY STUDIES:  White cell count is 6.1, hemoglobin 15, hematocrit 43.5, platelet count 204.  INR is 1.7.  IMPRESSION:  [name] is a very nice 47 year old gentleman with a history of recurrent deep venous thrombosis of the right leg.  This  deep venous thrombosis was found probably about 3 or 4 years ago.  He had his first deep venous thrombosis back in, I think, 2003 or 2004.  He is on lifelong Coumadin.  He is doing okay on Coumadin.  His INR is a little on the lower side.  However, I just do not think that we have to be that aggressive with keeping him therapeutic.  I do want to check his INR in about 3 months.  I think if his INR is not improved, then we might have to increase his Coumadin dosage.  I will see him back in 6 months myself.    ______________________________ Josph Macho, M.D. PRE/MEDQ  D:  05/30/2013  T:  06/04/2013  Job:  [id number]

## 2013-07-28 ENCOUNTER — Other Ambulatory Visit: Payer: Self-pay | Admitting: Hematology & Oncology

## 2013-08-13 NOTE — Progress Notes (Signed)
CC:   [facility]  DIAGNOSES: 1. Recurrent deep venous thrombosis of the right leg. 2. Protein S deficiency.  CURRENT THERAPY:  Coumadin 2.5 mg p.o. daily.  INTERIM HISTORY:  Mr. Maryclare Bean comes in for his followup.  He is doing fairly well.  He is still working for [employer].  He has been pretty busy.  He has had no problems with pain in the right leg.  He says his right leg does get swollen every now and then when he is standing on it.  He has had no problems with his left leg.  There have been no bowel or bladder issues.  He has had no bleeding. There has been no cough or shortness of breath.  He has had no headache. He has had no rashes.  PHYSICAL EXAMINATION:  General:  This is a well-developed, well- nourished white gentleman, in no obvious distress.  Vital Signs: Temperature of 98, pulse 68, respiratory rate 18, blood pressure 123/72. Weight is 207 pounds.  Head and Neck:  Normocephalic, atraumatic skull. There are no ocular or oral lesions.  There are no palpable cervical or supraclavicular lymph nodes.  Lungs:  Clear to percussion and auscultation bilaterally.  Cardiac:  Regular rate and rhythm with a normal S1, S2.  There are no murmurs, rubs, or bruits.  Abdomen:  Soft. He has good bowel sounds.  There is no fluid wave.  There is no palpable hepatosplenomegaly.  Extremities:  No clubbing, cyanosis, or edema. There may be some slight nonpitting edema of the right lower leg.  He has no venous cord in the right leg.  There is a negative Homans sign with the right leg.  Left leg is unremarkable.  Skin:  No rashes, ecchymoses, or petechiae.  Neurologic:  No focal neurological deficits.  LABORATORY STUDIES:  White cell count is 6.1, hemoglobin 15, hematocrit 43.5, platelet count 204.  INR is 1.7.  IMPRESSION:  Mr. Maryclare Bean is a very nice 47 year old gentleman with a history of recurrent deep venous thrombosis of the right leg.  This deep venous  thrombosis was found probably about 3 or 4 years ago.  He had his first DVT back in, I think, 2003 or 2004.  He is on lifelong Coumadin.  He is doing okay on Coumadin.  His INR is a little on the lower side.  However, I just do not think that we have to be that aggressive with keeping him therapeutic.  I do want to check his INR in about 3 months.  I think if his INR has not improved, then we might have to increase his Coumadin dosage.  I will see him back in 6 months myself.    ______________________________ Josph Macho, M.D. PRE/MEDQ  D:  05/30/2013  T:  08/11/2013  Job:  [id number]

## 2013-08-22 ENCOUNTER — Other Ambulatory Visit (HOSPITAL_BASED_OUTPATIENT_CLINIC_OR_DEPARTMENT_OTHER): Payer: 59

## 2013-08-22 DIAGNOSIS — I82409 Acute embolism and thrombosis of unspecified deep veins of unspecified lower extremity: Secondary | ICD-10-CM

## 2013-08-22 DIAGNOSIS — I82401 Acute embolism and thrombosis of unspecified deep veins of right lower extremity: Secondary | ICD-10-CM

## 2013-08-22 LAB — PROTIME-INR

## 2013-08-28 ENCOUNTER — Telehealth: Payer: Self-pay | Admitting: Nurse Practitioner

## 2013-08-28 DIAGNOSIS — I82401 Acute embolism and thrombosis of unspecified deep veins of right lower extremity: Secondary | ICD-10-CM

## 2013-08-28 NOTE — Telephone Encounter (Addendum)
Message copied by Glee Arvin on Wed Aug 28, 2013 10:29 AM ------      Message from: Josph Macho      Created: Fri Aug 23, 2013  6:31 AM       Please call and let him know that his blood is a little thick. How much Coumadin is he taking?? Please let me know.[name] ------pt verbalized understanding and appreciation. Appointment set up for next week to check INR>

## 2013-08-28 NOTE — Telephone Encounter (Addendum)
Message copied by Glee Arvin on Wed Aug 28, 2013 10:43 AM ------      Message from: Josph Macho      Created: Fri Aug 23, 2013  6:31 AM       Please call and let him know that his blood is a little thick. How much Coumadin is he taking?? Please let me know.[name] ------Pt was instructed to increase dosage to 5mg .

## 2013-09-03 ENCOUNTER — Other Ambulatory Visit (HOSPITAL_BASED_OUTPATIENT_CLINIC_OR_DEPARTMENT_OTHER): Payer: 59

## 2013-09-03 DIAGNOSIS — I82409 Acute embolism and thrombosis of unspecified deep veins of unspecified lower extremity: Secondary | ICD-10-CM

## 2013-09-03 DIAGNOSIS — I82401 Acute embolism and thrombosis of unspecified deep veins of right lower extremity: Secondary | ICD-10-CM

## 2013-09-03 LAB — PROTIME-INR
INR: 2.8 (ref 2.00–3.50)
Protime: 33.6 Seconds — ABNORMAL HIGH (ref 10.6–13.4)

## 2013-09-04 ENCOUNTER — Telehealth: Payer: Self-pay | Admitting: *Deleted

## 2013-09-04 NOTE — Telephone Encounter (Signed)
Called patient to let him know that his INR was stable and no change in Coumadin dose.  Needs to recheck in 4 weeks.  Left message for scheduler to call patient on Monday and schedule

## 2013-09-08 ENCOUNTER — Other Ambulatory Visit: Payer: Self-pay | Admitting: Hematology & Oncology

## 2013-09-09 ENCOUNTER — Telehealth: Payer: Self-pay | Admitting: Hematology & Oncology

## 2013-09-09 NOTE — Telephone Encounter (Signed)
Pt aware of 1-28 lab at [facility]

## 2013-09-19 ENCOUNTER — Telehealth: Payer: Self-pay | Admitting: Hematology & Oncology

## 2013-09-19 NOTE — Telephone Encounter (Signed)
Left message on RN line to check about 1-28 lab pt cx since he scheduled 1-23 lab

## 2013-09-26 ENCOUNTER — Other Ambulatory Visit: Payer: Self-pay | Admitting: *Deleted

## 2013-09-26 DIAGNOSIS — I82409 Acute embolism and thrombosis of unspecified deep veins of unspecified lower extremity: Secondary | ICD-10-CM

## 2013-09-27 ENCOUNTER — Telehealth: Payer: Self-pay | Admitting: *Deleted

## 2013-09-27 ENCOUNTER — Other Ambulatory Visit (HOSPITAL_BASED_OUTPATIENT_CLINIC_OR_DEPARTMENT_OTHER): Payer: 59

## 2013-09-27 DIAGNOSIS — D6859 Other primary thrombophilia: Secondary | ICD-10-CM

## 2013-09-27 DIAGNOSIS — I82409 Acute embolism and thrombosis of unspecified deep veins of unspecified lower extremity: Secondary | ICD-10-CM

## 2013-09-27 LAB — PROTHROMBIN TIME
INR: 3.85 — AB (ref <1.50)
PROTHROMBIN TIME: 36.4 s — AB (ref 11.6–15.2)

## 2013-09-27 LAB — PROTIME-INR

## 2013-09-27 NOTE — Telephone Encounter (Signed)
Called [facility] about INR which was 3.85. Told patient to stop Coumadin on Sat and Sunday and restart Monday.  Patient to have labwork rechecked in one week.per dr. Marin Olp

## 2013-10-01 ENCOUNTER — Telehealth: Payer: Self-pay | Admitting: Nurse Practitioner

## 2013-10-01 NOTE — Telephone Encounter (Signed)
Pt called to gain clarification on Coumadin dosing. Pt instructed per Dr. Marin Olp to take 2.5mg /day and he has been scheduled for a f/u lab on 1/30 @1145 . Pt verbalized understanding and appreciation.

## 2013-10-02 ENCOUNTER — Other Ambulatory Visit: Payer: 59

## 2013-10-03 ENCOUNTER — Other Ambulatory Visit: Payer: Self-pay | Admitting: Nurse Practitioner

## 2013-10-03 DIAGNOSIS — I82409 Acute embolism and thrombosis of unspecified deep veins of unspecified lower extremity: Secondary | ICD-10-CM

## 2013-10-04 ENCOUNTER — Encounter (INDEPENDENT_AMBULATORY_CARE_PROVIDER_SITE_OTHER): Payer: Self-pay

## 2013-10-04 ENCOUNTER — Other Ambulatory Visit (HOSPITAL_BASED_OUTPATIENT_CLINIC_OR_DEPARTMENT_OTHER): Payer: 59

## 2013-10-04 DIAGNOSIS — I82409 Acute embolism and thrombosis of unspecified deep veins of unspecified lower extremity: Secondary | ICD-10-CM

## 2013-10-04 DIAGNOSIS — D6859 Other primary thrombophilia: Secondary | ICD-10-CM

## 2013-10-04 LAB — PROTIME-INR
INR: 2 (ref 2.00–3.50)
Protime: 24 Seconds — ABNORMAL HIGH (ref 10.6–13.4)

## 2013-11-20 ENCOUNTER — Other Ambulatory Visit: Payer: 59 | Admitting: Lab

## 2013-11-20 ENCOUNTER — Ambulatory Visit: Payer: 59 | Admitting: Hematology & Oncology

## 2013-11-21 ENCOUNTER — Other Ambulatory Visit (HOSPITAL_BASED_OUTPATIENT_CLINIC_OR_DEPARTMENT_OTHER): Payer: 59 | Admitting: Lab

## 2013-11-21 ENCOUNTER — Encounter: Payer: Self-pay | Admitting: Hematology & Oncology

## 2013-11-21 ENCOUNTER — Ambulatory Visit (HOSPITAL_BASED_OUTPATIENT_CLINIC_OR_DEPARTMENT_OTHER): Payer: 59 | Admitting: Hematology & Oncology

## 2013-11-21 VITALS — BP 133/63 | HR 54 | Temp 97.9°F | Resp 18 | Wt 211.0 lb

## 2013-11-21 DIAGNOSIS — D6859 Other primary thrombophilia: Secondary | ICD-10-CM

## 2013-11-21 DIAGNOSIS — I82401 Acute embolism and thrombosis of unspecified deep veins of right lower extremity: Secondary | ICD-10-CM

## 2013-11-21 DIAGNOSIS — I82409 Acute embolism and thrombosis of unspecified deep veins of unspecified lower extremity: Secondary | ICD-10-CM

## 2013-11-21 DIAGNOSIS — Z86718 Personal history of other venous thrombosis and embolism: Secondary | ICD-10-CM

## 2013-11-21 DIAGNOSIS — O2230 Deep phlebothrombosis in pregnancy, unspecified trimester: Secondary | ICD-10-CM

## 2013-11-21 LAB — CBC WITH DIFFERENTIAL (CANCER CENTER ONLY)
BASO#: 0 10*3/uL (ref 0.0–0.2)
BASO%: 0.4 % (ref 0.0–2.0)
EOS%: 1 % (ref 0.0–7.0)
Eosinophils Absolute: 0.1 10*3/uL (ref 0.0–0.5)
HEMATOCRIT: 42.4 % (ref 38.7–49.9)
HEMOGLOBIN: 14.4 g/dL (ref 13.0–17.1)
LYMPH#: 1.9 10*3/uL (ref 0.9–3.3)
LYMPH%: 35.7 % (ref 14.0–48.0)
MCH: 29.9 pg (ref 28.0–33.4)
MCHC: 34 g/dL (ref 32.0–35.9)
MCV: 88 fL (ref 82–98)
MONO#: 0.4 10*3/uL (ref 0.1–0.9)
MONO%: 8.3 % (ref 0.0–13.0)
NEUT%: 54.6 % (ref 40.0–80.0)
NEUTROS ABS: 2.9 10*3/uL (ref 1.5–6.5)
Platelets: 200 10*3/uL (ref 145–400)
RBC: 4.82 10*6/uL (ref 4.20–5.70)
RDW: 13.1 % (ref 11.1–15.7)
WBC: 5.2 10*3/uL (ref 4.0–10.0)

## 2013-11-21 LAB — PROTIME-INR (CHCC SATELLITE)
INR: 2.2 (ref 2.0–3.5)
Protime: 26.4 Seconds — ABNORMAL HIGH (ref 10.6–13.4)

## 2013-11-21 NOTE — Progress Notes (Signed)
Hematology and Oncology Follow Up Visit  Jivan Symanski [id number] 04/19/1966 48 y.o. 11/21/2013   Principle Diagnosis:   Recurrent DVT of the right leg  Protein S deficiency  Current Therapy:    Coumadin 2.5 mg by mouth daily-maintain INR between 2-3     Interim History:  Mr.  Deanthony Maull is back for followup. I see him every 6 months. He's doing well. He's had no problems with Coumadin. His right leg is swollen on occasion. It really does not bother him.  There is no cough. He's had no chest wall pain. He's had no bleeding.  Is up has been good. He's had no nausea or vomiting. There's been no change in bowel or bladder habits.  Is still working without any difficulties. There is no fatigue or weakness.  Medications: Current outpatient prescriptions:busPIRone (BUSPAR) 7.5 MG tablet, Take 7.5 mg by mouth 2 (two) times daily., Disp: , Rfl: ;  Multiple Vitamin (MULTIVITAMIN) tablet, Take 1 tablet by mouth daily., Disp: , Rfl: ;  warfarin (COUMADIN) 5 MG tablet, Take 5 mg by mouth daily. Pt takes 1/2 tabs daily= 2.5 mg., Disp: , Rfl:   Allergies: No Known Allergies  Past Medical History, Surgical history, Social history, and Family History were reviewed and updated.  Review of Systems: As above  Physical Exam:  weight is 211 lb (95.709 kg). His temperature is 97.9 F (36.6 C). His blood pressure is 133/63 and his pulse is 54. His respiration is 18.   Well-developed and well-nourished. Right leg is slightly swollen. No venous cord is noted. He has a negative Homans sign. He has good pulses. Other extremities are unremarkable. Lungs are clear. Cardiac exam regular in rhythm. Abdomen is soft. Skin exam no ecchymoses or petechia. Neurological exam no focal deficits.  Lab Results  Component Value Date   WBC 5.2 11/21/2013   HGB 14.4 11/21/2013   HCT 42.4 11/21/2013   MCV 88 11/21/2013   PLT 200 11/21/2013     Chemistry   No results found for this basename: NA, K, CL, CO2, BUN,  CREATININE, GLU   No results found for this basename: CALCIUM, ALKPHOS, AST, ALT, BILITOT         Impression and Plan: Mr. Mithran Strike is 48 year old gentleman. He has a history of recurrent thrombus of the right leg. His last INR is very therapeutic.  We will continue to check his I will plan to see him back in one year. We will check his INR every 3 months   [name], MD 3/19/201512:07 PM

## 2014-01-27 ENCOUNTER — Other Ambulatory Visit: Payer: Self-pay | Admitting: Hematology & Oncology

## 2014-02-13 ENCOUNTER — Other Ambulatory Visit: Payer: 59

## 2014-05-14 ENCOUNTER — Other Ambulatory Visit (HOSPITAL_BASED_OUTPATIENT_CLINIC_OR_DEPARTMENT_OTHER): Payer: 59

## 2014-05-14 ENCOUNTER — Telehealth: Payer: Self-pay | Admitting: *Deleted

## 2014-05-14 DIAGNOSIS — D6859 Other primary thrombophilia: Secondary | ICD-10-CM

## 2014-05-14 DIAGNOSIS — O2230 Deep phlebothrombosis in pregnancy, unspecified trimester: Secondary | ICD-10-CM

## 2014-05-14 LAB — CBC WITH DIFFERENTIAL/PLATELET
BASO%: 0.9 % (ref 0.0–2.0)
Basophils Absolute: 0.1 10*3/uL (ref 0.0–0.1)
EOS%: 0.8 % (ref 0.0–7.0)
Eosinophils Absolute: 0.1 10*3/uL (ref 0.0–0.5)
HEMATOCRIT: 42.9 % (ref 38.4–49.9)
HGB: 14.3 g/dL (ref 13.0–17.1)
LYMPH#: 2 10*3/uL (ref 0.9–3.3)
LYMPH%: 24 % (ref 14.0–49.0)
MCH: 29.1 pg (ref 27.2–33.4)
MCHC: 33.4 g/dL (ref 32.0–36.0)
MCV: 87.1 fL (ref 79.3–98.0)
MONO#: 0.5 10*3/uL (ref 0.1–0.9)
MONO%: 6.6 % (ref 0.0–14.0)
NEUT%: 67.7 % (ref 39.0–75.0)
NEUTROS ABS: 5.5 10*3/uL (ref 1.5–6.5)
Platelets: 222 10*3/uL (ref 140–400)
RBC: 4.93 10*6/uL (ref 4.20–5.82)
RDW: 12.8 % (ref 11.0–14.6)
WBC: 8.2 10*3/uL (ref 4.0–10.3)

## 2014-05-14 LAB — PROTIME-INR
INR: 1.6 — ABNORMAL LOW (ref 2.00–3.50)
Protime: 19.2 Seconds — ABNORMAL HIGH (ref 10.6–13.4)

## 2014-05-14 NOTE — Telephone Encounter (Addendum)
Message copied by Lenn Sink on Wed May 14, 2014 11:17 AM ------      Message from: Burney Gauze R      Created: Wed May 14, 2014 10:22 AM       Call - INR is a little low!!  I would not change his coumadin dose!! I think we need to re0check his INR in 4 weeks.  Please set up!!  [name] ------Informed pt of lab results and let him know to call the office back to schedule an appt.

## 2014-05-15 LAB — D-DIMER, QUANTITATIVE: D-Dimer, Quant: 0.32 ug/mL-FEU (ref 0.00–0.48)

## 2014-05-26 ENCOUNTER — Telehealth: Payer: Self-pay | Admitting: *Deleted

## 2014-05-26 NOTE — Telephone Encounter (Addendum)
Message copied by Lenn Sink on Mon May 26, 2014  9:27 AM ------      Message from: Burney Gauze R      Created: Wed May 14, 2014 10:22 AM       Call - INR is a little low!!  I would not change his coumadin dose!! I think we need to re0check his INR in 4 weeks.  Please set up!!  [name] ------Pt has still not set up an appt with our office. Left voicemail informing pt to call our office and make an appt!

## 2014-05-28 ENCOUNTER — Telehealth: Payer: Self-pay | Admitting: Hematology & Oncology

## 2014-05-28 NOTE — Telephone Encounter (Signed)
Pt left message to call, I left him message to call °

## 2014-05-28 NOTE — Telephone Encounter (Signed)
Left pt and wife message to call for appointment

## 2014-06-13 ENCOUNTER — Other Ambulatory Visit: Payer: Self-pay | Admitting: Nurse Practitioner

## 2014-06-13 DIAGNOSIS — I82409 Acute embolism and thrombosis of unspecified deep veins of unspecified lower extremity: Secondary | ICD-10-CM

## 2014-06-13 DIAGNOSIS — D6859 Other primary thrombophilia: Secondary | ICD-10-CM

## 2014-06-18 ENCOUNTER — Other Ambulatory Visit (HOSPITAL_BASED_OUTPATIENT_CLINIC_OR_DEPARTMENT_OTHER): Payer: 59

## 2014-06-18 DIAGNOSIS — I82409 Acute embolism and thrombosis of unspecified deep veins of unspecified lower extremity: Secondary | ICD-10-CM

## 2014-06-18 DIAGNOSIS — D6859 Other primary thrombophilia: Secondary | ICD-10-CM

## 2014-06-18 LAB — PROTIME-INR
INR: 1.8 — ABNORMAL LOW (ref 2.00–3.50)
Protime: 21.6 Seconds — ABNORMAL HIGH (ref 10.6–13.4)

## 2014-06-19 ENCOUNTER — Telehealth: Payer: Self-pay | Admitting: Nurse Practitioner

## 2014-06-19 NOTE — Telephone Encounter (Addendum)
Message copied by Jimmy Footman on Thu Jun 19, 2014  6:04 PM ------      Message from: Burney Gauze R      Created: Wed Jun 18, 2014  6:58 PM       Call that the Coumadin is okay and no change in dose. [name] ------Pt verbalized understanding and appreciation.

## 2014-08-12 ENCOUNTER — Other Ambulatory Visit: Payer: 59

## 2014-10-21 ENCOUNTER — Other Ambulatory Visit: Payer: Self-pay | Admitting: Hematology & Oncology

## 2014-11-12 ENCOUNTER — Other Ambulatory Visit: Payer: Self-pay | Admitting: Nurse Practitioner

## 2014-11-12 DIAGNOSIS — D6859 Other primary thrombophilia: Secondary | ICD-10-CM

## 2014-11-12 DIAGNOSIS — I82409 Acute embolism and thrombosis of unspecified deep veins of unspecified lower extremity: Secondary | ICD-10-CM

## 2014-11-13 ENCOUNTER — Other Ambulatory Visit (HOSPITAL_BASED_OUTPATIENT_CLINIC_OR_DEPARTMENT_OTHER): Payer: 59 | Admitting: Lab

## 2014-11-13 ENCOUNTER — Encounter: Payer: Self-pay | Admitting: Hematology & Oncology

## 2014-11-13 ENCOUNTER — Ambulatory Visit (HOSPITAL_BASED_OUTPATIENT_CLINIC_OR_DEPARTMENT_OTHER): Payer: 59 | Admitting: Hematology & Oncology

## 2014-11-13 VITALS — BP 132/71 | HR 63 | Temp 98.4°F | Resp 18 | Ht 70.0 in | Wt 207.0 lb

## 2014-11-13 DIAGNOSIS — I82401 Acute embolism and thrombosis of unspecified deep veins of right lower extremity: Secondary | ICD-10-CM

## 2014-11-13 DIAGNOSIS — O2230 Deep phlebothrombosis in pregnancy, unspecified trimester: Secondary | ICD-10-CM

## 2014-11-13 DIAGNOSIS — I82402 Acute embolism and thrombosis of unspecified deep veins of left lower extremity: Secondary | ICD-10-CM

## 2014-11-13 DIAGNOSIS — D6859 Other primary thrombophilia: Secondary | ICD-10-CM

## 2014-11-13 DIAGNOSIS — I82409 Acute embolism and thrombosis of unspecified deep veins of unspecified lower extremity: Secondary | ICD-10-CM

## 2014-11-13 LAB — CBC WITH DIFFERENTIAL (CANCER CENTER ONLY)
BASO#: 0 10*3/uL (ref 0.0–0.2)
BASO%: 0.5 % (ref 0.0–2.0)
EOS ABS: 0.1 10*3/uL (ref 0.0–0.5)
EOS%: 1.3 % (ref 0.0–7.0)
HCT: 45.2 % (ref 38.7–49.9)
HGB: 15.4 g/dL (ref 13.0–17.1)
LYMPH#: 2.3 10*3/uL (ref 0.9–3.3)
LYMPH%: 35.9 % (ref 14.0–48.0)
MCH: 30.1 pg (ref 28.0–33.4)
MCHC: 34.1 g/dL (ref 32.0–35.9)
MCV: 89 fL (ref 82–98)
MONO#: 0.5 10*3/uL (ref 0.1–0.9)
MONO%: 8.4 % (ref 0.0–13.0)
NEUT%: 53.9 % (ref 40.0–80.0)
NEUTROS ABS: 3.4 10*3/uL (ref 1.5–6.5)
Platelets: 185 10*3/uL (ref 145–400)
RBC: 5.11 10*6/uL (ref 4.20–5.70)
RDW: 12.8 % (ref 11.1–15.7)
WBC: 6.3 10*3/uL (ref 4.0–10.0)

## 2014-11-13 LAB — PROTIME-INR (CHCC SATELLITE)
INR: 2 (ref 2.0–3.5)
Protime: 24 Seconds — ABNORMAL HIGH (ref 10.6–13.4)

## 2014-11-13 NOTE — Progress Notes (Signed)
Hematology and Oncology Follow Up Visit  Connor Howell [id number] Jan 05, 1966 49 y.o. 11/13/2014   Principle Diagnosis:   Recurrent DVT of the right leg  Protein S deficiency  Current Therapy:    Coumadin 2.5 mg by mouth daily-maintain INR between 2-3     Interim History:  Connor Howell is back for followup. I see him every 6 months. He's doing well. He's had no problems with Coumadin. His right leg is swollen on occasion. It really does not bother him.  There is no cough. He's had no chest wall pain. He's had no bleeding.  He has had no problems with bleeding. He's had no bruising. There's been no change in bowel or bladder habits.  He is still working. 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  Medications:  Current outpatient prescriptions:  .  busPIRone (BUSPAR) 7.5 MG tablet, Take 7.5 mg by mouth 2 (two) times daily. TAKES 1/2 OF A 7.5 TAB  BID, Disp: , Rfl:  .  Multiple Vitamin (MULTIVITAMIN) tablet, Take 1 tablet by mouth daily., Disp: , Rfl:  .  warfarin (COUMADIN) 5 MG tablet, TAKE 1 TABLET BY MOUTH EVERY DAY, Disp: 30 tablet, Rfl: 3  Allergies: No Known Allergies  Past Medical History, Surgical history, Social history, and Family History were reviewed and updated.  Review of Systems: As above  Physical Exam:  height is 5\' 10"  (1.778 m) and weight is 207 lb (93.895 kg). His oral temperature is 98.4 F (36.9 C). His blood pressure is 132/71 and his pulse is 63. His respiration is 18.   Well-developed and well-nourished. Head and neck exam shows no ocular or oral lesions. He has no palpable cervical or supraclavicular lymph nodes. Right leg is slightly swollen. No venous cord is noted. He has a negative Homans sign. He has good pulses. Other extremities are unremarkable. Lungs are clear. Cardiac exam regular rate and rhythm with no murmurs, rubs or bruits.. Abdomen is soft. He has good bowel sounds. There is no fluid wave. There  is no palpable liver or spleen tip. Back exam shows no tenderness over the spine, ribs or hips. Skin exam no ecchymoses or petechia. Neurological exam shows no focal deficits.  Lab Results  Component Value Date   WBC 6.3 11/13/2014   HGB 15.4 11/13/2014   HCT 45.2 11/13/2014   MCV 89 11/13/2014   PLT 185 11/13/2014     Chemistry   No results found for: NA No results found for: CALCIUM       Impression and Plan: Mr. Alice Vitelli is 49 year old gentleman. He has a history of recurrent thrombus of the right leg. His last INR is very therapeutic.  I think that we can jsut see him back in 6 months. His INR has been therapeutic and I think that there will not need to be a change in his dosing.  Connor Napoleon, MD 3/10/20169:20 AM

## 2014-11-14 LAB — D-DIMER, QUANTITATIVE (NOT AT ARMC): D DIMER QUANT: 0.37 ug{FEU}/mL (ref 0.00–0.48)

## 2015-03-23 ENCOUNTER — Other Ambulatory Visit: Payer: Self-pay | Admitting: Hematology & Oncology

## 2015-05-14 ENCOUNTER — Encounter: Payer: Self-pay | Admitting: Family

## 2015-05-14 ENCOUNTER — Ambulatory Visit (HOSPITAL_BASED_OUTPATIENT_CLINIC_OR_DEPARTMENT_OTHER): Payer: 59 | Admitting: Family

## 2015-05-14 ENCOUNTER — Other Ambulatory Visit (HOSPITAL_BASED_OUTPATIENT_CLINIC_OR_DEPARTMENT_OTHER): Payer: 59

## 2015-05-14 VITALS — BP 127/71 | HR 16 | Temp 97.9°F | Resp 16 | Ht 70.0 in | Wt 206.0 lb

## 2015-05-14 DIAGNOSIS — I82401 Acute embolism and thrombosis of unspecified deep veins of right lower extremity: Secondary | ICD-10-CM

## 2015-05-14 DIAGNOSIS — I82402 Acute embolism and thrombosis of unspecified deep veins of left lower extremity: Secondary | ICD-10-CM

## 2015-05-14 DIAGNOSIS — D6859 Other primary thrombophilia: Secondary | ICD-10-CM

## 2015-05-14 LAB — CBC WITH DIFFERENTIAL (CANCER CENTER ONLY)
BASO#: 0 10*3/uL (ref 0.0–0.2)
BASO%: 0.4 % (ref 0.0–2.0)
EOS%: 0.7 % (ref 0.0–7.0)
Eosinophils Absolute: 0 10*3/uL (ref 0.0–0.5)
HCT: 43.1 % (ref 38.7–49.9)
HGB: 14.6 g/dL (ref 13.0–17.1)
LYMPH#: 1.9 10*3/uL (ref 0.9–3.3)
LYMPH%: 33.5 % (ref 14.0–48.0)
MCH: 29.6 pg (ref 28.0–33.4)
MCHC: 33.9 g/dL (ref 32.0–35.9)
MCV: 87 fL (ref 82–98)
MONO#: 0.5 10*3/uL (ref 0.1–0.9)
MONO%: 8.6 % (ref 0.0–13.0)
NEUT#: 3.2 10*3/uL (ref 1.5–6.5)
NEUT%: 56.8 % (ref 40.0–80.0)
PLATELETS: 186 10*3/uL (ref 145–400)
RBC: 4.93 10*6/uL (ref 4.20–5.70)
RDW: 12.6 % (ref 11.1–15.7)
WBC: 5.6 10*3/uL (ref 4.0–10.0)

## 2015-05-14 LAB — PROTIME-INR (CHCC SATELLITE)
INR: 1.9 — ABNORMAL LOW (ref 2.0–3.5)
PROTIME: 22.8 s — AB (ref 10.6–13.4)

## 2015-05-14 NOTE — Progress Notes (Signed)
Hematology and Oncology Follow Up Visit  Connor Howell [id number] 1966/06/09 49 y.o. 05/14/2015   Principle Diagnosis:  Recurrent DVT of the right leg Protein S deficiency  Current Therapy:   Coumadin 2.5 mg by mouth daily-maintain INR between 2-3    Interim History:  [name] is here today for a follow-up. He is doing well and has not had much swelling or pain in the right leg. He has not been wearing his compression stocking as often as he had. He does still have one. He will let us know if he needs a prescription for another.  He has had no issues with bleeding or bruising while on Coumadin. He has his INR checked every 6 months and has been therapeutic for quite some time. His INR today is 1.9.  No fever, chills, n/v, cough, rash, dizziness, SOB, chest pain, palpitations, abdominal pain, changes in bowel or bladder habits. No numbness or tingling in his extremities. No new aches or pains.  He is eating well and making sure to stay hydrated. His weight is stable.   Medications:    Medication List       This list is accurate as of: 05/14/15  8:58 AM.  Always use your most recent med list.               busPIRone 7.5 MG tablet  Commonly known as:  BUSPAR  Take 7.5 mg by mouth 2 (two) times daily. TAKES 1/2 OF A 7.5 TAB  BID     multivitamin tablet  Take 1 tablet by mouth daily.     warfarin 5 MG tablet  Commonly known as:  COUMADIN  TAKE 1 TABLET BY MOUTH EVERY DAY        Allergies: No Known Allergies  Past Medical History, Surgical history, Social history, and Family History were reviewed and updated.  Review of Systems: All other 10 point review of systems is negative.   Physical Exam:  height is 5\' 10"  (1.778 m) and weight is 206 lb (93.441 kg). His oral temperature is 97.9 F (36.6 C). His blood pressure is 127/71 and his pulse is 16. His respiration is 16.   Wt Readings from Last 3 Encounters:  05/14/15 206 lb (93.441 kg)  11/13/14 207 lb (93.895  kg)  11/21/13 211 lb (95.709 kg)    Ocular: Sclerae unicteric, pupils equal, round and reactive to light Ear-nose-throat: Oropharynx clear, dentition fair Lymphatic: No cervical or supraclavicular adenopathy Lungs no rales or rhonchi, good excursion bilaterally Heart regular rate and rhythm, no murmur appreciated Abd soft, nontender, positive bowel sounds MSK no focal spinal tenderness, no joint edema Neuro: non-focal, well-oriented, appropriate affect Breasts: Deferred   Lab Results  Component Value Date   WBC 5.6 05/14/2015   HGB 14.6 05/14/2015   HCT 43.1 05/14/2015   MCV 87 05/14/2015   PLT 186 05/14/2015   No results found for: FERRITIN, IRON, TIBC, UIBC, IRONPCTSAT Lab Results  Component Value Date   RBC 4.93 05/14/2015   No results found for: KPAFRELGTCHN, LAMBDASER, KAPLAMBRATIO No results found for: IGGSERUM, IGA, IGMSERUM No results found for: TOTALPROTELP, ALBUMINELP, A1GS, A2GS, BETS, BETA2SER, GAMS, MSPIKE, SPEI   Chemistry   No results found for: NA, K, CL, CO2, BUN, CREATININE, GLU No results found for: CALCIUM, ALKPHOS, AST, ALT, BILITOT   Impression and Plan: [name] is a pleasant 49 yo gentleman with a history of recurrent thrombus of the right leg. His last INR has stayed theraputic  for quite some time. He is 1.9 today. There has been no issue with bleeding or bruising.  He is doing well and is asymptomatic at this time.  We will plan to see him back in 6 months for labs and follow-up.  He will let us know if he needs his coumadin refilled or a new compression stocking.  He knows to contact us with any questions or concerns. We can certainly see him sooner if need be.   Eliezer Bottom, NP 9/8/20168:58 AM

## 2015-11-17 ENCOUNTER — Ambulatory Visit: Payer: 59 | Admitting: Hematology & Oncology

## 2015-11-17 ENCOUNTER — Other Ambulatory Visit: Payer: 59

## 2015-11-20 ENCOUNTER — Other Ambulatory Visit (HOSPITAL_BASED_OUTPATIENT_CLINIC_OR_DEPARTMENT_OTHER): Payer: 59

## 2015-11-20 ENCOUNTER — Ambulatory Visit (HOSPITAL_BASED_OUTPATIENT_CLINIC_OR_DEPARTMENT_OTHER): Payer: 59 | Admitting: Hematology & Oncology

## 2015-11-20 ENCOUNTER — Encounter: Payer: Self-pay | Admitting: Hematology & Oncology

## 2015-11-20 VITALS — BP 145/72 | HR 74 | Temp 97.9°F | Resp 16 | Ht 70.0 in | Wt 214.0 lb

## 2015-11-20 DIAGNOSIS — Z86718 Personal history of other venous thrombosis and embolism: Secondary | ICD-10-CM | POA: Diagnosis not present

## 2015-11-20 DIAGNOSIS — D6859 Other primary thrombophilia: Secondary | ICD-10-CM

## 2015-11-20 DIAGNOSIS — I82401 Acute embolism and thrombosis of unspecified deep veins of right lower extremity: Secondary | ICD-10-CM

## 2015-11-20 DIAGNOSIS — I82409 Acute embolism and thrombosis of unspecified deep veins of unspecified lower extremity: Secondary | ICD-10-CM

## 2015-11-20 LAB — CBC WITH DIFFERENTIAL (CANCER CENTER ONLY)
BASO#: 0 10*3/uL (ref 0.0–0.2)
BASO%: 0.4 % (ref 0.0–2.0)
EOS ABS: 0.1 10*3/uL (ref 0.0–0.5)
EOS%: 1.2 % (ref 0.0–7.0)
HCT: 42.5 % (ref 38.7–49.9)
HGB: 14.6 g/dL (ref 13.0–17.1)
LYMPH#: 2.5 10*3/uL (ref 0.9–3.3)
LYMPH%: 35.6 % (ref 14.0–48.0)
MCH: 29.7 pg (ref 28.0–33.4)
MCHC: 34.4 g/dL (ref 32.0–35.9)
MCV: 86 fL (ref 82–98)
MONO#: 0.5 10*3/uL (ref 0.1–0.9)
MONO%: 6.5 % (ref 0.0–13.0)
NEUT#: 3.9 10*3/uL (ref 1.5–6.5)
NEUT%: 56.3 % (ref 40.0–80.0)
PLATELETS: 204 10*3/uL (ref 145–400)
RBC: 4.92 10*6/uL (ref 4.20–5.70)
RDW: 13 % (ref 11.1–15.7)
WBC: 6.9 10*3/uL (ref 4.0–10.0)

## 2015-11-20 LAB — PROTIME-INR (CHCC SATELLITE)
INR: 2.5 (ref 2.0–3.5)
PROTIME: 30 s — AB (ref 10.6–13.4)

## 2015-11-20 NOTE — Progress Notes (Signed)
Hematology and Oncology Follow Up Visit  Connor Howell [id number] 01/26/1966 50 y.o. 11/20/2015   Principle Diagnosis:   Recurrent DVT of the right leg  Protein S deficiency  Current Therapy:    Coumadin 2.5 mg by mouth daily-maintain INR between 2-3     Interim History:  Mr.  Connor Howell is back for followup. I see him every 6 months. He's doing well. He's had no problems with Coumadin. His right leg is swollen on occasion. It really does not bother him.  There is no cough. He's had no chest wall pain. He's had no bleeding.  He has had no problems with bleeding. He's had no bruising. There's been no change in bowel or bladder habits.  He is still working. 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  Medications:  Current outpatient prescriptions:  .  busPIRone (BUSPAR) 7.5 MG tablet, Take 7.5 mg by mouth 2 (two) times daily. TAKES 1/2 OF A 7.5 TAB  BID, Disp: , Rfl:  .  Multiple Vitamin (MULTIVITAMIN) tablet, Take 1 tablet by mouth daily., Disp: , Rfl:  .  warfarin (COUMADIN) 5 MG tablet, TAKE 1 TABLET BY MOUTH EVERY DAY (Patient taking differently: TAKE 1/2 TABLET PER DAY), Disp: 30 tablet, Rfl: 3  Allergies: No Known Allergies  Past Medical History, Surgical history, Social history, and Family History were reviewed and updated.  Review of Systems: As above  Physical Exam:  height is 5\' 10"  (1.778 m) and weight is 214 lb (97.07 kg). His oral temperature is 97.9 F (36.6 C). His blood pressure is 145/72 and his pulse is 74. His respiration is 16.   Well-developed and well-nourished. Head and neck exam shows no ocular or oral lesions. He has no palpable cervical or supraclavicular lymph nodes. Right leg is slightly swollen. No venous cord is noted. He has a negative Homans sign. He has good pulses. Other extremities are unremarkable. Lungs are clear. Cardiac exam regular rate and rhythm with no murmurs, rubs or bruits.. Abdomen is soft. He  has good bowel sounds. There is no fluid wave. There is no palpable liver or spleen tip. Back exam shows no tenderness over the spine, ribs or hips. Skin exam no ecchymoses or petechia. Neurological exam shows no focal deficits.  Lab Results  Component Value Date   WBC 6.9 11/20/2015   HGB 14.6 11/20/2015   HCT 42.5 11/20/2015   MCV 86 11/20/2015   PLT 204 11/20/2015     Chemistry   No results found for: NA No results found for: CALCIUM       Impression and Plan: [name] is 50 year old gentleman. He has a history of recurrent thrombus of the right leg. His last INR is very therapeutic.  I think that we can See him back in one year now.   I think we can check his INR every 6 months.   [name], MD 3/17/20173:09 PM

## 2015-12-10 ENCOUNTER — Other Ambulatory Visit: Payer: Self-pay | Admitting: Hematology & Oncology

## 2016-03-07 ENCOUNTER — Other Ambulatory Visit: Payer: Self-pay | Admitting: Family Medicine

## 2016-03-07 DIAGNOSIS — N5089 Other specified disorders of the male genital organs: Secondary | ICD-10-CM

## 2016-03-11 ENCOUNTER — Ambulatory Visit
Admission: RE | Admit: 2016-03-11 | Discharge: 2016-03-11 | Disposition: A | Discharge status: Home / Self Care | Payer: 59 | Source: Ambulatory Visit | Attending: Family Medicine | Admitting: Family Medicine

## 2016-03-11 DIAGNOSIS — N5089 Other specified disorders of the male genital organs: Secondary | ICD-10-CM

## 2016-03-14 ENCOUNTER — Other Ambulatory Visit: Payer: Self-pay | Admitting: Family

## 2016-03-14 ENCOUNTER — Encounter (HOSPITAL_BASED_OUTPATIENT_CLINIC_OR_DEPARTMENT_OTHER): Payer: Self-pay | Admitting: *Deleted

## 2016-03-14 ENCOUNTER — Other Ambulatory Visit: Payer: Self-pay | Admitting: Urology

## 2016-03-14 ENCOUNTER — Telehealth: Payer: Self-pay | Admitting: Family

## 2016-03-14 DIAGNOSIS — D6859 Other primary thrombophilia: Secondary | ICD-10-CM

## 2016-03-14 NOTE — Telephone Encounter (Signed)
I spoke with [name] over the phone and gave him Coumadin instructions prior to surgery on Monday. He will be having a left orchiectomy. He will stop his coumadin on Thursday this week and resume Monday evening after surgery. He will not need a Lovenox bridge. We will bring him back in the week after surgery and check his PT/INR. I also sent these instructionss to Dr. Pilar Jarvis.

## 2016-03-14 NOTE — Progress Notes (Signed)
NPO AFTER MN.  ARRIVE AT 0745.  NEEDS CBC, BMET, PT/INR, PTT.

## 2016-03-20 ENCOUNTER — Encounter (HOSPITAL_BASED_OUTPATIENT_CLINIC_OR_DEPARTMENT_OTHER): Payer: Self-pay | Admitting: Anesthesiology

## 2016-03-20 NOTE — Anesthesia Preprocedure Evaluation (Addendum)
Anesthesia Evaluation  Patient identified by MRN, date of birth, ID band Patient awake    Reviewed: Allergy & Precautions, NPO status , Patient's Chart, lab work & pertinent test results  Airway Mallampati: II  TM Distance: >3 FB Neck ROM: Full    Dental no notable dental hx. (+) Teeth Intact, Dental Advisory Given, Caps,    Pulmonary neg pulmonary ROS,    Pulmonary exam normal breath sounds clear to auscultation       Cardiovascular Exercise Tolerance: Good + Peripheral Vascular Disease  Normal cardiovascular exam Rhythm:Regular Rate:Normal     Neuro/Psych negative neurological ROS  negative psych ROS   GI/Hepatic negative GI ROS, Neg liver ROS,   Endo/Other  negative endocrine ROS  Renal/GU negative Renal ROS   Left testicular mass    Musculoskeletal negative musculoskeletal ROS (+)   Abdominal   Peds  Hematology Hx/o DVT Protein S deficiency Anticoagulated on Coumadin- Last dose   Anesthesia Other Findings   Reproductive/Obstetrics                         Anesthesia Physical Anesthesia Plan  ASA: II  Anesthesia Plan: General   Post-op Pain Management:    Induction: Intravenous  Airway Management Planned: LMA  Additional Equipment:   Intra-op Plan:   Post-operative Plan: Extubation in OR  Informed Consent: I have reviewed the patients History and Physical, chart, labs and discussed the procedure including the risks, benefits and alternatives for the proposed anesthesia with the patient or authorized representative who has indicated his/her understanding and acceptance.   Dental advisory given  Plan Discussed with: Anesthesiologist, CRNA and Surgeon  Anesthesia Plan Comments:       Anesthesia Quick Evaluation

## 2016-03-21 ENCOUNTER — Ambulatory Visit (HOSPITAL_BASED_OUTPATIENT_CLINIC_OR_DEPARTMENT_OTHER)
Admission: RE | Admit: 2016-03-21 | Discharge: 2016-03-21 | Disposition: A | Discharge status: Home / Self Care | Payer: 59 | Source: Ambulatory Visit | Attending: Urology | Admitting: Urology

## 2016-03-21 ENCOUNTER — Encounter (HOSPITAL_BASED_OUTPATIENT_CLINIC_OR_DEPARTMENT_OTHER)
Admission: RE | Disposition: A | Discharge status: Home / Self Care | Payer: Self-pay | Source: Ambulatory Visit | Attending: Urology

## 2016-03-21 ENCOUNTER — Ambulatory Visit (HOSPITAL_BASED_OUTPATIENT_CLINIC_OR_DEPARTMENT_OTHER): Payer: 59 | Admitting: Anesthesiology

## 2016-03-21 ENCOUNTER — Encounter (HOSPITAL_BASED_OUTPATIENT_CLINIC_OR_DEPARTMENT_OTHER): Payer: Self-pay | Admitting: *Deleted

## 2016-03-21 DIAGNOSIS — Z86711 Personal history of pulmonary embolism: Secondary | ICD-10-CM | POA: Diagnosis not present

## 2016-03-21 DIAGNOSIS — Z86718 Personal history of other venous thrombosis and embolism: Secondary | ICD-10-CM | POA: Insufficient documentation

## 2016-03-21 DIAGNOSIS — I739 Peripheral vascular disease, unspecified: Secondary | ICD-10-CM | POA: Diagnosis not present

## 2016-03-21 DIAGNOSIS — C6292 Malignant neoplasm of left testis, unspecified whether descended or undescended: Secondary | ICD-10-CM | POA: Insufficient documentation

## 2016-03-21 DIAGNOSIS — N5089 Other specified disorders of the male genital organs: Secondary | ICD-10-CM | POA: Diagnosis present

## 2016-03-21 DIAGNOSIS — Z7901 Long term (current) use of anticoagulants: Secondary | ICD-10-CM | POA: Diagnosis not present

## 2016-03-21 DIAGNOSIS — C801 Malignant (primary) neoplasm, unspecified: Secondary | ICD-10-CM

## 2016-03-21 HISTORY — DX: Other specified disorders of the male genital organs: N50.89

## 2016-03-21 HISTORY — DX: Malignant (primary) neoplasm, unspecified: C80.1

## 2016-03-21 HISTORY — PX: ORCHIECTOMY: SHX2116

## 2016-03-21 HISTORY — DX: Long term (current) use of anticoagulants: Z79.01

## 2016-03-21 HISTORY — DX: Personal history of other venous thrombosis and embolism: Z86.718

## 2016-03-21 LAB — CBC
HEMATOCRIT: 43.5 % (ref 39.0–52.0)
HEMOGLOBIN: 14.9 g/dL (ref 13.0–17.0)
MCH: 30.2 pg (ref 26.0–34.0)
MCHC: 34.3 g/dL (ref 30.0–36.0)
MCV: 88.2 fL (ref 78.0–100.0)
Platelets: 205 10*3/uL (ref 150–400)
RBC: 4.93 MIL/uL (ref 4.22–5.81)
RDW: 12.8 % (ref 11.5–15.5)
WBC: 5.5 10*3/uL (ref 4.0–10.5)

## 2016-03-21 LAB — BASIC METABOLIC PANEL
ANION GAP: 6 (ref 5–15)
BUN: 16 mg/dL (ref 6–20)
CHLORIDE: 107 mmol/L (ref 101–111)
CO2: 25 mmol/L (ref 22–32)
Calcium: 9.2 mg/dL (ref 8.9–10.3)
Creatinine, Ser: 0.93 mg/dL (ref 0.61–1.24)
GFR calc Af Amer: >60 mL/min (ref >60)
Glucose, Bld: 101 mg/dL — ABNORMAL HIGH (ref 65–99)
POTASSIUM: 4 mmol/L (ref 3.5–5.1)
SODIUM: 138 mmol/L (ref 135–145)

## 2016-03-21 LAB — APTT: APTT: 25 s (ref 24–37)

## 2016-03-21 LAB — PROTIME-INR
INR: 1.2 (ref 0.00–1.49)
Prothrombin Time: 14.9 seconds (ref 11.6–15.2)

## 2016-03-21 SURGERY — ORCHIECTOMY
Anesthesia: General | Site: Inguinal | Laterality: Left

## 2016-03-21 MED ORDER — HYDROCODONE-ACETAMINOPHEN 5-325 MG PO TABS: 1.0000 | ORAL_TABLET | ORAL | Status: DC | PRN

## 2016-03-21 MED ORDER — PROPOFOL 10 MG/ML IV BOLUS
INTRAVENOUS | Status: AC
  Filled 2016-03-21: qty 20

## 2016-03-21 MED ORDER — CEFAZOLIN SODIUM-DEXTROSE 2-4 GM/100ML-% IV SOLN
2.0000 g | INTRAVENOUS | Status: AC
  Administered 2016-03-21: 2 g via INTRAVENOUS
  Filled 2016-03-21: qty 100

## 2016-03-21 MED ORDER — MIDAZOLAM HCL 5 MG/5ML IJ SOLN
INTRAMUSCULAR | Status: DC | PRN
  Administered 2016-03-21: 2 mg via INTRAVENOUS

## 2016-03-21 MED ORDER — HYDROCODONE-ACETAMINOPHEN 5-325 MG PO TABS
1.0000 | ORAL_TABLET | Freq: Four times a day (QID) | ORAL | Status: DC | PRN
  Administered 2016-03-21: 2 via ORAL
  Filled 2016-03-21: qty 2

## 2016-03-21 MED ORDER — ONDANSETRON HCL 4 MG/2ML IJ SOLN
INTRAMUSCULAR | Status: AC
  Filled 2016-03-21: qty 2

## 2016-03-21 MED ORDER — MIDAZOLAM HCL 2 MG/2ML IJ SOLN
INTRAMUSCULAR | Status: AC
  Filled 2016-03-21: qty 2

## 2016-03-21 MED ORDER — HYDROMORPHONE HCL 1 MG/ML IJ SOLN
0.2500 mg | INTRAMUSCULAR | Status: DC | PRN
  Administered 2016-03-21 (×2): 0.5 mg via INTRAVENOUS
  Filled 2016-03-21: qty 1

## 2016-03-21 MED ORDER — DEXAMETHASONE SODIUM PHOSPHATE 10 MG/ML IJ SOLN
INTRAMUSCULAR | Status: DC | PRN
  Administered 2016-03-21: 10 mg via INTRAVENOUS

## 2016-03-21 MED ORDER — MEPERIDINE HCL 25 MG/ML IJ SOLN
6.2500 mg | INTRAMUSCULAR | Status: DC | PRN
  Filled 2016-03-21: qty 1

## 2016-03-21 MED ORDER — LACTATED RINGERS IV SOLN
INTRAVENOUS | Status: DC
  Administered 2016-03-21: 08:00:00 via INTRAVENOUS
  Filled 2016-03-21: qty 1000

## 2016-03-21 MED ORDER — DEXAMETHASONE SODIUM PHOSPHATE 10 MG/ML IJ SOLN
INTRAMUSCULAR | Status: AC
  Filled 2016-03-21: qty 1

## 2016-03-21 MED ORDER — LIDOCAINE HCL (CARDIAC) 20 MG/ML IV SOLN
INTRAVENOUS | Status: DC | PRN
  Administered 2016-03-21: 100 mg via INTRAVENOUS

## 2016-03-21 MED ORDER — LIDOCAINE HCL (CARDIAC) 20 MG/ML IV SOLN
INTRAVENOUS | Status: AC
  Filled 2016-03-21: qty 5

## 2016-03-21 MED ORDER — SODIUM CHLORIDE 0.9 % IR SOLN
Status: DC | PRN
  Administered 2016-03-21: 500 mL

## 2016-03-21 MED ORDER — HYDROMORPHONE HCL 1 MG/ML IJ SOLN
INTRAMUSCULAR | Status: AC
  Filled 2016-03-21: qty 1

## 2016-03-21 MED ORDER — ONDANSETRON HCL 4 MG/2ML IJ SOLN
INTRAMUSCULAR | Status: DC | PRN
  Administered 2016-03-21: 4 mg via INTRAVENOUS

## 2016-03-21 MED ORDER — HYDROCODONE-ACETAMINOPHEN 5-325 MG PO TABS
ORAL_TABLET | ORAL | Status: AC
  Filled 2016-03-21: qty 2

## 2016-03-21 MED ORDER — CEFAZOLIN SODIUM-DEXTROSE 2-4 GM/100ML-% IV SOLN
INTRAVENOUS | Status: AC
  Filled 2016-03-21: qty 100

## 2016-03-21 MED ORDER — FENTANYL CITRATE (PF) 100 MCG/2ML IJ SOLN
INTRAMUSCULAR | Status: AC
  Filled 2016-03-21: qty 4

## 2016-03-21 MED ORDER — PROPOFOL 500 MG/50ML IV EMUL
INTRAVENOUS | Status: DC | PRN
  Administered 2016-03-21: 200 mL via INTRAVENOUS

## 2016-03-21 MED ORDER — CEPHALEXIN 500 MG PO CAPS: 500.0000 mg | ORAL_CAPSULE | Freq: Three times a day (TID) | ORAL | Status: DC

## 2016-03-21 MED ORDER — HYDROCODONE-ACETAMINOPHEN 7.5-325 MG PO TABS
1.0000 | ORAL_TABLET | Freq: Once | ORAL | Status: DC | PRN
  Filled 2016-03-21: qty 1

## 2016-03-21 MED ORDER — FENTANYL CITRATE (PF) 100 MCG/2ML IJ SOLN
INTRAMUSCULAR | Status: DC | PRN
  Administered 2016-03-21 (×2): 50 ug via INTRAVENOUS

## 2016-03-21 MED ORDER — BUPIVACAINE HCL (PF) 0.25 % IJ SOLN
INTRAMUSCULAR | Status: DC | PRN
  Administered 2016-03-21: 10 mL

## 2016-03-21 MED ORDER — PROMETHAZINE HCL 25 MG/ML IJ SOLN
12.5000 mg | Freq: Once | INTRAMUSCULAR | Status: DC | PRN
  Filled 2016-03-21: qty 1

## 2016-03-21 SURGICAL SUPPLY — 46 items
APPLICATOR COTTON TIP 6IN STRL (MISCELLANEOUS) IMPLANT
BLADE CLIPPER SURG (BLADE) IMPLANT
BLADE SURG 15 STRL LF DISP TIS (BLADE) ×1 IMPLANT
BLADE SURG 15 STRL SS (BLADE) ×2
BNDG GAUZE ELAST 4 BULKY (GAUZE/BANDAGES/DRESSINGS) IMPLANT
COVER BACK TABLE 60X90IN (DRAPES) ×2 IMPLANT
COVER MAYO STAND STRL (DRAPES) ×2 IMPLANT
DISSECTOR ROUND CHERRY 3/8 STR (MISCELLANEOUS) IMPLANT
DRAIN PENROSE 18X1/2 LTX STRL (DRAIN) IMPLANT
DRAPE LAPAROTOMY 100X72 PEDS (DRAPES) ×2 IMPLANT
DRSG TEGADERM 4X4.75 (GAUZE/BANDAGES/DRESSINGS) IMPLANT
ELECT NDL TIP 2.8 STRL (NEEDLE) IMPLANT
ELECT NEEDLE TIP 2.8 STRL (NEEDLE) IMPLANT
ELECT REM PT RETURN 9FT ADLT (ELECTROSURGICAL) ×2
ELECTRODE REM PT RTRN 9FT ADLT (ELECTROSURGICAL) ×1 IMPLANT
GLOVE BIOGEL PI IND STRL 7.0 (GLOVE) IMPLANT
GLOVE BIOGEL PI IND STRL 7.5 (GLOVE) ×1 IMPLANT
GLOVE BIOGEL PI INDICATOR 7.0 (GLOVE) ×1
GLOVE BIOGEL PI INDICATOR 7.5 (GLOVE) ×2
GLOVE SURG SS PI 7.0 STRL IVOR (GLOVE) ×3 IMPLANT
GOWN STRL REUS W/ TWL LRG LVL3 (GOWN DISPOSABLE) ×1 IMPLANT
GOWN STRL REUS W/ TWL XL LVL3 (GOWN DISPOSABLE) ×1 IMPLANT
GOWN STRL REUS W/TWL LRG LVL3 (GOWN DISPOSABLE) ×2
GOWN STRL REUS W/TWL XL LVL3 (GOWN DISPOSABLE) ×2
KIT ROOM TURNOVER WOR (KITS) ×2 IMPLANT
LIQUID BAND (GAUZE/BANDAGES/DRESSINGS) IMPLANT
NDL HYPO 25X1 1.5 SAFETY (NEEDLE) ×1 IMPLANT
NEEDLE HYPO 25X1 1.5 SAFETY (NEEDLE) ×2 IMPLANT
NS IRRIG 500ML POUR BTL (IV SOLUTION) IMPLANT
PACK BASIN DAY SURGERY FS (CUSTOM PROCEDURE TRAY) ×2 IMPLANT
PENCIL BUTTON HOLSTER BLD 10FT (ELECTRODE) ×2 IMPLANT
SUPPORT SCROTAL LG STRP (MISCELLANEOUS) IMPLANT
SUT SILK 0 SH 30 (SUTURE) ×2 IMPLANT
SUT SILK 2 0 SH (SUTURE) ×2 IMPLANT
SUT VIC AB 2-0 SH 27 (SUTURE) ×2
SUT VIC AB 2-0 SH 27XBRD (SUTURE) ×1 IMPLANT
SUT VIC AB 3-0 SH 27 (SUTURE) ×2
SUT VIC AB 3-0 SH 27X BRD (SUTURE) ×1 IMPLANT
SUT VIC AB 4-0 PS2 18 (SUTURE) ×2 IMPLANT
SYR 30ML LL (SYRINGE) IMPLANT
SYR CONTROL 10ML LL (SYRINGE) ×2 IMPLANT
TOWEL OR 17X24 6PK STRL BLUE (TOWEL DISPOSABLE) ×4 IMPLANT
TRAY DSU PREP LF (CUSTOM PROCEDURE TRAY) ×2 IMPLANT
TUBE CONNECTING 12X1/4 (SUCTIONS) ×2 IMPLANT
WATER STERILE IRR 500ML POUR (IV SOLUTION) IMPLANT
YANKAUER SUCT BULB TIP NO VENT (SUCTIONS) ×2 IMPLANT

## 2016-03-21 NOTE — Discharge Instructions (Signed)
HOME CARE INSTRUCTIONS FOR SCROTAL PROCEDURES ° °Wound Care & Hygiene: °You may apply an ice bag to the scrotum for the first 24 hours.  This may help decrease swelling and soreness.  You may have a dressing held in place by an athletic supporter.  You may remove the dressing in 24 hours and shower in 48 hours.  Continue to use the athletic supporter or tight briefs for at least a week. °Activity: °Rest today - not necessarily flat bed rest.  Just take it easy.  You should not do strenuous activities until your follow-up visit with your doctor.  You may resume light activity in 48 hours. ° °Return to Work: ° °Your doctor will advise you of this depending on the type of work you do ° °Diet: °Drink liquids or eat a light diet this evening.  You may resume a regular diet tomorrow. ° °General Expectations: °You may have a small amount of bleeding.  The scrotum may be swollen or bruised for about a week. ° °Call your Doctor if these occur: ° -persistent or heavy bleeding ° -temperature of 101 degrees or more ° -severe pain, not relieved by your pain medication ° °Return to Doctor's Office:  °Call to set up and appointment. ° °Patient Signature:  __________________________________________________ ° °Nurse's Signature:  __________________________________________________ ° °Post Anesthesia Home Care Instructions ° °Activity: °Get plenty of rest for the remainder of the day. A responsible adult should stay with you for 24 hours following the procedure.  °For the next 24 hours, DO NOT: °-Drive a car °-Operate machinery °-Drink alcoholic beverages °-Take any medication unless instructed by your physician °-Make any legal decisions or sign important papers. ° °Meals: °Start with liquid foods such as gelatin or soup. Progress to regular foods as tolerated. Avoid greasy, spicy, heavy foods. If nausea and/or vomiting occur, drink only clear liquids until the nausea and/or vomiting subsides. Call your physician if vomiting  continues. ° °Special Instructions/Symptoms: °Your throat may feel dry or sore from the anesthesia or the breathing tube placed in your throat during surgery. If this causes discomfort, gargle with warm salt water. The discomfort should disappear within 24 hours. ° °If you had a scopolamine patch placed behind your ear for the management of post- operative nausea and/or vomiting: ° °1. The medication in the patch is effective for 72 hours, after which it should be removed.  Wrap patch in a tissue and discard in the trash. Wash hands thoroughly with soap and water. °2. You may remove the patch earlier than 72 hours if you experience unpleasant side effects which may include dry mouth, dizziness or visual disturbances. °3. Avoid touching the patch. Wash your hands with soap and water after contact with the patch. °  ° °

## 2016-03-21 NOTE — H&P (Signed)
CC: I have swelling in my scrotum.  HPI: Connor Howell is a 50 year-old male patient who is here for scrotal swelling.  The mass is on the left side. He noticed his testicular mass 2 months. The mass has grown in size. He does not have testicular pain on the side of his mass. His mass does not cause restriction of normal activities. He has not had injuries to the testicles or scrotum.   He has not had scrotal surgery.   He has not recently had unwanted weight loss. He is not having pain in new locations. He has not had shortness of breath.     ALLERGIES: Epinephrine    MEDICATIONS: Coumadin 2.5 mg tablet  Multivitamin     GU PSH: None   NON-GU PSH: Knee Arthroscopy/surgery, Right    GU PMH: None   NON-GU PMH: Acute embolism and thrombosis of deep veins of left upper extremity    FAMILY HISTORY: father deceased at age 68 - Father mother deceased at age 61 - Mother   SOCIAL HISTORY: Marital Status: Married Current Smoking Status: Patient has never smoked.  Types of alcohol consumed: Beer. Moderate Drinker.  Drinks 2 caffeinated drinks per day.    REVIEW OF SYSTEMS:    GU Review Male:   Patient denies frequent urination, hard to postpone urination, burning/ pain with urination, get up at night to urinate, leakage of urine, stream starts and stops, trouble starting your stream, have to strain to urinate , erection problems, and penile pain.  Gastrointestinal (Upper):   Patient denies nausea, vomiting, and indigestion/ heartburn.  Gastrointestinal (Lower):   Patient denies diarrhea and constipation.  Constitutional:   Patient denies fever, night sweats, weight loss, and fatigue.  Skin:   Patient denies skin rash/ lesion and itching.  Eyes:   Patient denies blurred vision and double vision.  Ears/ Nose/ Throat:   Patient denies sore throat and sinus problems.  Hematologic/Lymphatic:   Patient denies swollen glands and easy bruising.  Cardiovascular:   Patient denies leg  swelling and chest pains.  Respiratory:   Patient denies cough and shortness of breath.  Endocrine:   Patient denies excessive thirst.  Musculoskeletal:   Patient denies back pain and joint pain.  Neurological:   Patient denies headaches and dizziness.  Psychologic:   Patient denies depression and anxiety.   VITAL SIGNS:      03/14/2016 11:05 AM  Weight 208 lb / 94.35 kg  Height 71 in / 180.34 cm  BP 132/80 mmHg  Pulse 65 /min  Temperature 98.4 F / 37 C  BMI 29.0 kg/m   GU PHYSICAL EXAMINATION:    Scrotum: No lesions. No edema. No cysts. No warts.  Testes: Solid mass left testis. No tenderness, no swelling, no enlargement left testis. No tenderness, no swelling, no enlargement right testis. Normal location left testis. Normal location right testis. No cyst, no varicocele, no hydrocele left testis. No mass, no cyst, no varicocele, no hydrocele right testis. left solid mass of left testicle. testicule diffusely firm to palpation  Penis: Circumcised, no warts, no cracks. No dorsal Peyronie's plaques, no left corporal Peyronie's plaques, no right corporal Peyronie's plaques, no scarring, no warts. No balanitis, no meatal stenosis.   MULTI-SYSTEM PHYSICAL EXAMINATION:    Constitutional: Well-nourished. No physical deformities. Normally developed. Good grooming.  Neck: Neck symmetrical, not swollen. Normal tracheal position.  Respiratory: No labored breathing, no use of accessory muscles.   Cardiovascular: Normal temperature, normal extremity pulses, no swelling, no  varicosities.  Lymphatic: No enlargement of neck, axillae, groin.  Skin: No paleness, no jaundice, no cyanosis. No lesion, no ulcer, no rash.  Neurologic / Psychiatric: Oriented to time, oriented to place, oriented to person. No depression, no anxiety, no agitation.  Gastrointestinal: No mass, no tenderness, no rigidity, non obese abdomen.  Eyes: Normal conjunctivae. Normal eyelids.  Ears, Nose, Mouth, and Throat: Left ear no  scars, no lesions, no masses. Right ear no scars, no lesions, no masses. Nose no scars, no lesions, no masses. Normal hearing. Normal lips.  Musculoskeletal: Normal gait and station of head and neck.     PAST DATA REVIEWED:  Source Of History:  Patient   PROCEDURES:          Urinalysis - 81003 Dipstick Dipstick Cont'd  Specimen: Voided Bilirubin: Neg  Color: Yellow Ketones: Neg  Appearance: Clear Blood: Neg  Specific Gravity: 1.010 Protein: Neg  pH: 5.5 Urobilinogen: 0.2  Glucose: Neg Nitrites: Neg    Leukocyte Esterase: Neg    ASSESSMENT:      ICD-10 Details  1 GU:   Testis disorders, other noninflammatory disorders - N44.8    PLAN:           Orders Labs AFP, Beta HCG, LDH  Lab Notes: if lab is scheduled at same time interval of office visit, please have labs completed 1 week prior to return visit           Schedule Procedure: Unspecified Date - Rad Inguinal Orchiectomy [id number], left          Document Letter(s):  Created for [employer] Jacelyn Grip, MD   Created for Patient: Clinical Summary    The risks, benefits, and some of the possible complications of the proposed procedure were discussed with the patient at length and in detail. The possibility of having a positive tissue margin as well as the possible need for further surgical procedures was discussed with the patient. The possible need for postoperative treatments including further surgical procedures, chemotherapy, immunotherapy, radiation therapy, and others was discussed with the patient. The possibility that this operative procedure might not to cure the underlying disease, that micrometastatic disease might already be present, and that this underlying disease might result in the death of the patient was discussed.  The general risks of the operative procedure and the perioperative period were discussed with the patient at length and in detail including swelling, pain, nausea, vomiting, fever, chills, infection, wound  infection, sepsis, renal failure, internal or external bleeding, intraoperative bowel, organ or vascular injuries, postoperative formation of scar tissue, the need for blood transfusions, deep venous thrombosis or blood clots, pulmonary embolus, pneumonia, respiratory failure, heart attack, stroke, death and others.   All of the patient's questions were answered and he voiced an understanding of these risks, benefits and possible complications. The patient gave fully informed consent to proceed with the procedure.          Notes:   the patient has a left solid testicular mass is concerning for testicular cancer. We discussed that he more than likely has a pure seminoma based on his age and other pathologies are possible. Discussed the next opposite left inguinal radical orchiectomy. All questions were answered. The patient has agreed to proceed. We will also check his AFP, beta-hCG LDH at this time.

## 2016-03-21 NOTE — Transfer of Care (Signed)
Immediate Anesthesia Transfer of Care Note  Patient: Connor Howell  Procedure(s) Performed: Procedure(s) (LRB): LEFT RADICAL ORCHIECTOMY (Left)  Patient Location: PACU  Anesthesia Type: General  Level of Consciousness: awake, sedated, patient cooperative and responds to stimulation  Airway & Oxygen Therapy: Patient Spontanous Breathing and Patient connected to [place] O2  Post-op Assessment: Report given to PACU RN, Post -op Vital signs reviewed and stable and Patient moving all extremities  Post vital signs: Reviewed and stable  Complications: No apparent anesthesia complications

## 2016-03-21 NOTE — Anesthesia Postprocedure Evaluation (Signed)
Anesthesia Post Note  Patient: Connor Howell  Procedure(s) Performed: Procedure(s) (LRB): LEFT RADICAL ORCHIECTOMY (Left)  Patient location during evaluation: PACU Anesthesia Type: General Level of consciousness: awake and alert Pain management: pain level controlled Vital Signs Assessment: post-procedure vital signs reviewed and stable Respiratory status: spontaneous breathing, nonlabored ventilation and respiratory function stable Cardiovascular status: blood pressure returned to baseline and stable Postop Assessment: no signs of nausea or vomiting Anesthetic complications: no    Last Vitals:  Filed Vitals:   03/21/16 1045 03/21/16 1100  BP: 119/84 123/92  Pulse: 68 54  Temp:    Resp: 19 12    Last Pain:  Filed Vitals:   03/21/16 1109  PainSc: 0-No pain                 Ayra Hodgdon A.

## 2016-03-21 NOTE — Anesthesia Procedure Notes (Signed)
Procedure Name: LMA Insertion Date/Time: 03/21/2016 9:26 AM Performed by: Wanita Chamberlain Pre-anesthesia Checklist: Patient identified, Timeout performed, Emergency Drugs available, Suction available and Patient being monitored Patient Re-evaluated:Patient Re-evaluated prior to inductionOxygen Delivery Method: Circle system utilized Preoxygenation: Pre-oxygenation with 100% oxygen Intubation Type: IV induction Ventilation: Mask ventilation without difficulty LMA: LMA inserted LMA Size: 4.0 Number of attempts: 1 Placement Confirmation: positive ETCO2 and breath sounds checked- equal and bilateral Tube secured with: Tape Dental Injury: Teeth and Oropharynx as per pre-operative assessment

## 2016-03-21 NOTE — Op Note (Signed)
Date of procedure: 03/21/2016  Preoperative diagnosis:  1. Left testicular mass   Postoperative diagnosis:  1. Left testicular mass   Procedure: 1. Left radical orchiectomy  Surgeon: Baruch Gouty, MD  Anesthesia: General  Complications: None  Intraoperative findings: The patient had a firm left testicle with a mass is concerning for malignancy. He underwent an uneventful left radical orchiectomy.  EBL: 2 cc  Specimens: Left testicle and spermatic cord  Drains: None  Disposition: Stable to the postanesthesia care unit  Indication for procedure: The patient is a 50 y.o. male with a left testicular mass with ultrasound physical exam concerning for malignancy presents today for left radical orchiectomy.  After reviewing the management options for treatment, the patient elected to proceed with the above surgical procedure(s). We have discussed the potential benefits and risks of the procedure, side effects of the proposed treatment, the likelihood of the patient achieving the goals of the procedure, and any potential problems that might occur during the procedure or recuperation. Informed consent has been obtained.  Description of procedure: The patient was met in the preoperative area. All risks, benefits, and indications of the procedure were described in great detail. The patient consented to the procedure. Preoperative antibiotics were given. The patient was taken to the operative theater. General anesthesia was induced per the anesthesia service. The patient was placed in supine position. He was prepped and draped in usual sterile fashion. Timeout was called.  A 5 cm incision was made over the patient's left inguinal ring. Electrocautery was then used to dissect subcutaneous tissues. The spermatic cord was encountered immediately clamped with a Claiborne Billings. The testicle was delivered from the left hemiscrotum. The gubernaculum on the left side was incised. The spermatic cord was further  freed. A second Claiborne Billings was then placed proximal to the first [place]. The testicle was removed with scissors. It was sent to pathology. It did have the clamp on the distal end of it to ensure no spread was cut. A 2-0 silk was then used to stick tie on the spermatic cord. Proximal to this a 2-0 silk was then free tied onto the spermatic cord. The suture the silk was left long in the event the spermatic cord easily localized and RPL M.D. Subcutaneous tissues were then reapproximated with a running 3-0 Vicryl. Subcutaneous closure didn't place the 4-0 Vicryl. 10 cc of 0.25% Marcaine were then placed into the incision. Skin glue was then placed over the incision.   Plan: The patient will follow-up in the near future to discuss pathology results. He will likely need imaging pending the pathology results.  Baruch Gouty, M.D.

## 2016-03-22 ENCOUNTER — Encounter (HOSPITAL_BASED_OUTPATIENT_CLINIC_OR_DEPARTMENT_OTHER): Payer: Self-pay | Admitting: Urology

## 2016-03-22 ENCOUNTER — Other Ambulatory Visit: Payer: Self-pay | Admitting: Family

## 2016-03-22 ENCOUNTER — Other Ambulatory Visit: Payer: Self-pay | Admitting: Nurse Practitioner

## 2016-03-24 ENCOUNTER — Telehealth: Payer: Self-pay

## 2016-03-24 ENCOUNTER — Other Ambulatory Visit (HOSPITAL_BASED_OUTPATIENT_CLINIC_OR_DEPARTMENT_OTHER): Payer: 59

## 2016-03-24 DIAGNOSIS — D6859 Other primary thrombophilia: Secondary | ICD-10-CM

## 2016-03-24 DIAGNOSIS — Z86718 Personal history of other venous thrombosis and embolism: Secondary | ICD-10-CM

## 2016-03-24 LAB — PROTIME-INR (CHCC SATELLITE)
INR: 1.2 — AB (ref 2.0–3.5)
Protime: 14.4 Seconds — ABNORMAL HIGH (ref 10.6–13.4)

## 2016-03-24 NOTE — Telephone Encounter (Signed)
Pt in office for lab only. INR lower than targeted range. Restarted Coumadin on 7/17 after surgery. Per Dr Marin Olp, no need for Lovenox bridge at this time. Continue Coumadin. Recheck INR on 8/3 as scheduled. Pt verbalizes understanding and repeats back instructions. dph

## 2016-04-07 ENCOUNTER — Other Ambulatory Visit (HOSPITAL_BASED_OUTPATIENT_CLINIC_OR_DEPARTMENT_OTHER): Payer: 59

## 2016-04-07 ENCOUNTER — Other Ambulatory Visit: Payer: Self-pay | Admitting: Nurse Practitioner

## 2016-04-07 ENCOUNTER — Other Ambulatory Visit: Payer: 59

## 2016-04-07 DIAGNOSIS — I82409 Acute embolism and thrombosis of unspecified deep veins of unspecified lower extremity: Secondary | ICD-10-CM

## 2016-04-07 DIAGNOSIS — Z86718 Personal history of other venous thrombosis and embolism: Secondary | ICD-10-CM | POA: Diagnosis not present

## 2016-04-07 LAB — PROTIME-INR (CHCC SATELLITE)
INR: 2.8 (ref 2.0–3.5)
Protime: 33.6 Seconds — ABNORMAL HIGH (ref 10.6–13.4)

## 2016-04-12 ENCOUNTER — Other Ambulatory Visit: Payer: Self-pay | Admitting: Urology

## 2016-04-12 ENCOUNTER — Ambulatory Visit (HOSPITAL_COMMUNITY)
Admission: RE | Admit: 2016-04-12 | Discharge: 2016-04-12 | Disposition: A | Discharge status: Home / Self Care | Payer: 59 | Source: Ambulatory Visit | Attending: Urology | Admitting: Urology

## 2016-04-12 DIAGNOSIS — C6290 Malignant neoplasm of unspecified testis, unspecified whether descended or undescended: Secondary | ICD-10-CM | POA: Diagnosis present

## 2016-04-12 DIAGNOSIS — R918 Other nonspecific abnormal finding of lung field: Secondary | ICD-10-CM | POA: Diagnosis not present

## 2016-05-03 ENCOUNTER — Encounter: Payer: Self-pay | Admitting: Hematology & Oncology

## 2016-05-05 ENCOUNTER — Other Ambulatory Visit: Payer: Self-pay | Admitting: Hematology & Oncology

## 2016-05-10 ENCOUNTER — Encounter: Payer: Self-pay | Admitting: Radiation Oncology

## 2016-05-10 NOTE — Progress Notes (Signed)
GU Location of Tumor / Histology: Seminoma left testicle with a 1.1 cm left paraaortic lymph node  Connor Howell presented to Dr. Pilar Jarvis on March 14, 2016 with swelling in his scrotum. Patient reports his PCP referred him to Dr. Pilar Jarvis following his yearly physical for further evaluation of the swelling in his scrotum. Reported he noticed a testicular mass two months prior. Explained the mass had grown in size.    Past/Anticipated interventions by urology, if any: left inguinal radical orchiectomy on 03/21/16  Past/Anticipated interventions by medical oncology, if any: will follow up with Dr. Marin Olp to arrange for possible future chemotherapy. Patient reports he sees Dr. Marin Olp routinely for a blood disorder thus, he is very familiar with him. Patient reports he is scheduled to consult with Dr. Marin Olp tomorrow about chemotherapy.   Weight changes, if any: no  Bowel/Bladder complaints, if any: no   Nausea/Vomiting, if any: no  Pain issues, if any:  no  SAFETY ISSUES:  Prior radiation? no  Pacemaker/ICD? no  Possible current pregnancy? no  Is the patient on methotrexate? no  Current Complaints / other details:  50 year old male. Married. Works full time.

## 2016-05-11 ENCOUNTER — Encounter: Payer: Self-pay | Admitting: Radiation Oncology

## 2016-05-11 ENCOUNTER — Ambulatory Visit
Admission: RE | Admit: 2016-05-11 | Discharge: 2016-05-11 | Disposition: A | Discharge status: Home / Self Care | Payer: 59 | Source: Ambulatory Visit | Attending: Radiation Oncology | Admitting: Radiation Oncology

## 2016-05-11 DIAGNOSIS — Z86718 Personal history of other venous thrombosis and embolism: Secondary | ICD-10-CM | POA: Insufficient documentation

## 2016-05-11 DIAGNOSIS — C6292 Malignant neoplasm of left testis, unspecified whether descended or undescended: Secondary | ICD-10-CM | POA: Diagnosis not present

## 2016-05-11 DIAGNOSIS — Z9079 Acquired absence of other genital organ(s): Secondary | ICD-10-CM | POA: Diagnosis not present

## 2016-05-11 DIAGNOSIS — Z888 Allergy status to other drugs, medicaments and biological substances status: Secondary | ICD-10-CM | POA: Insufficient documentation

## 2016-05-11 DIAGNOSIS — Z510 Encounter for antineoplastic radiation therapy: Secondary | ICD-10-CM | POA: Diagnosis not present

## 2016-05-11 DIAGNOSIS — D6859 Other primary thrombophilia: Secondary | ICD-10-CM | POA: Insufficient documentation

## 2016-05-11 DIAGNOSIS — Z7901 Long term (current) use of anticoagulants: Secondary | ICD-10-CM | POA: Diagnosis not present

## 2016-05-11 DIAGNOSIS — C6212 Malignant neoplasm of descended left testis: Secondary | ICD-10-CM

## 2016-05-11 HISTORY — DX: Malignant (primary) neoplasm, unspecified: C80.1

## 2016-05-11 NOTE — Progress Notes (Signed)
See progress note under physician encounter. 

## 2016-05-11 NOTE — Progress Notes (Signed)
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 Seminoma of left testis, stage 2 (HCC) 186.9 C62.92     HISTORY OF PRESENT ILLNESS::Connor Howell is a 50 y.o. male who presented with scrotal swelling.  The mass was on the left side. He noticed the testicular mass 2 months. The mass has grown in size. He did not have testicular pain on the side of his mass. His mass does not cause restriction of normal activities. He has not had injuries to the testicles or scrotum. He had scrotal ultrasound on 03/11/16 showing 3.2 x 2.0 x 3.1 cm solid mass of the left testicle demonstrated blood flow and micro calcifications. This was consistent with testicular malignancy.  Right testicle was normal.  He proceeded to left radical orchiectomy with Dr. Pilar Jarvis on 03/21/16.  Pathology showed a 3.2 cm seminoma with LVI and involvement of the rete testis.  CT abdomen/pelvis on 04/12/16 showed an isolated 1.1 cm left retroperitoneal node suspicious for isolated nodal metastasis.  Marland Kitchen  PREVIOUS RADIATION THERAPY: No  Past Medical History:  Diagnosis Date  . Anticoagulated on Coumadin   . Cancer ([place]) 03/21/2016   seminoma left testis/testicular cancer  . History of DVT of lower extremity    first dx 2001  right lower leg w/ recurrency   . Protein S deficiency ([place])   . Testicular mass    LEFT  :   Past Surgical History:  Procedure Laterality Date  . KNEE ARTHROSCOPY W/ ACL RECONSTRUCTION Right 2003 approx  . ORCHIECTOMY Left 03/21/2016   Procedure: LEFT RADICAL ORCHIECTOMY;  Surgeon: [name], MD;  Location: [facility];  Service: Urology;  Laterality: Left;  [facility]  Prescriptions:  Marland Kitchen  Multiple Vitamin (MULTIVITAMIN) tablet, Take 1 tablet by mouth daily., Disp: , Rfl:  .  warfarin (COUMADIN) 5 MG tablet, TAKE 1 TABLET BY MOUTH EVERY DAY, Disp: 30 tablet, Rfl: 3le  Allergies  Allergen Reactions  . Epinephrine Other (See Comments)  :  REVIEW OF SYSTEMS:  A 15 point review of systems is documented in the electronic medical record. This was obtained by the nursing staff. However, I reviewed this with the patient to discuss relevant findings and make appropriate changes.  A comprehensive review of systems was negative.   PHYSICAL EXAM:  Blood pressure 124/88, pulse 70, resp. rate 16, height 5\' 11"  (1.803 m), weight 210 lb 14.4 oz (95.7 kg), SpO2 100 %. General appearance: alert, cooperative and appears stated age Head: Normocephalic, without obvious abnormality, asymmetric shape, atraumatic Neck: no adenopathy and supple, symmetrical, trachea midline Back: not examined Resp: normal unlabored effort Cardio: Normal rate GI: soft, non-tender; bowel sounds normal; no masses,  no organomegaly Male genitalia: normal, penis: no lesions or discharge. testes: no masses or tenderness. no hernias, abnormal findings: absent testes left Extremities: extremities normal, atraumatic, no cyanosis or edema Lymph nodes: Cervical, supraclavicular, and axillary nodes normal. Neurologic: Grossly normal Incision/Wound:  KPS = 100  100 - Normal; no complaints; no evidence of disease. 90   - Able to carry on normal activity; minor signs or symptoms of disease. 80   - Normal activity with effort; some signs or  symptoms of disease. 27   - Cares for self; unable to carry on normal activity or to do active work. 60   - Requires occasional assistance, but is able to care for most of his personal needs. 50   - Requires considerable assistance and frequent medical care. 48   - Disabled; requires special care and assistance. 14   - Severely disabled; hospital admission is indicated  although death not imminent. 81   - Very sick; hospital admission necessary; active supportive treatment necessary. 10   - Moribund; fatal processes progressing rapidly. 0     - Dead  Karnofsky DA, Abelmann [place], Craver LS and Burchenal [facility] [phone]) The use of the nitrogen mustards in the palliative treatment of carcinoma: with particular reference to bronchogenic carcinoma Cancer 1 634-56  LABORATORY DATA:  Lab Results  Component Value Date   WBC 5.5 03/21/2016   HGB 14.9 03/21/2016   HCT 43.5 03/21/2016   MCV 88.2 03/21/2016   PLT 205 03/21/2016   Lab Results  Component Value Date   NA 138 03/21/2016   K 4.0 03/21/2016   CL 107 03/21/2016   CO2 25 03/21/2016   No results found for: ALT, AST, GGT, ALKPHOS, BILITOT   RADIOGRAPHY: Dg Chest 2 View  Result Date: 04/12/2016 CLINICAL DATA:  Testicular cancer. EXAM: CHEST  2 VIEW COMPARISON:  01/08/2006. FINDINGS: Mediastinum hilar structures normal. No acute infiltrate. Left base pleural parenchymal thickening again noted consistent with scarring. Stable elevation left hemidiaphragm. Oral contrast in the colon. No acute bony abnormality. IMPRESSION: Left base pleural-parenchymal thickening again noted consistent scarring. No acute cardiopulmonary disease. 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  At this point, he would be eligible for radiotherapy to 30 Gy to para-aortic and left iliac nodes.  If he had multiple positive lymph nodes, the NCCN guidelines would also recommend radiation versus primary chemotherapy as displayed in the NCCN algorithm screenshotted below:      PLAN:Today, I talked to the patient and family about the findings and work-up thus far.  We discussed the natural history of Stage IIA Seminoma post-orchiectomy and general treatment, highlighting the role of radiotherapy in the management.  We discussed the  available radiation techniques, and focused on the details of logistics and delivery.  We reviewed the anticipated acute and late sequelae associated with radiation in this setting.  The patient was encouraged to ask questions that I answered to the best of my ability.    The patient will see Dr. Marin Olp from heme-onc tomorrow to discuss his diagnosis and treatment options including chemotherapy.  We'll follow-up with the patient later this week.  If he elects to proceed with radiation, we'll coordinate CT simulation.   I spent time face to face with the patient and more than 50% of that time was spent in counseling and/or coordination of care.   ------------------------------------------------   Tyler Pita, MD [facility] Director and Director of Stereotactic Radiosurgery Direct Dial: [phone]  Fax: [phone] [email or web address]  Insurance claims handler after treatment by NCCN:

## 2016-05-12 ENCOUNTER — Ambulatory Visit (HOSPITAL_BASED_OUTPATIENT_CLINIC_OR_DEPARTMENT_OTHER): Payer: 59 | Admitting: Hematology & Oncology

## 2016-05-12 ENCOUNTER — Other Ambulatory Visit: Payer: 59

## 2016-05-12 ENCOUNTER — Other Ambulatory Visit (HOSPITAL_BASED_OUTPATIENT_CLINIC_OR_DEPARTMENT_OTHER): Payer: 59

## 2016-05-12 VITALS — BP 130/75 | HR 63 | Temp 98.1°F | Resp 18 | Wt 208.0 lb

## 2016-05-12 DIAGNOSIS — Z7901 Long term (current) use of anticoagulants: Secondary | ICD-10-CM | POA: Diagnosis not present

## 2016-05-12 DIAGNOSIS — C6292 Malignant neoplasm of left testis, unspecified whether descended or undescended: Secondary | ICD-10-CM

## 2016-05-12 DIAGNOSIS — I82401 Acute embolism and thrombosis of unspecified deep veins of right lower extremity: Secondary | ICD-10-CM | POA: Diagnosis not present

## 2016-05-12 DIAGNOSIS — I82409 Acute embolism and thrombosis of unspecified deep veins of unspecified lower extremity: Secondary | ICD-10-CM | POA: Diagnosis not present

## 2016-05-12 LAB — LACTATE DEHYDROGENASE: LDH: 258 U/L — AB (ref 125–245)

## 2016-05-12 LAB — CMP (CANCER CENTER ONLY)
ALK PHOS: 68 U/L (ref 26–84)
ALT(SGPT): 36 U/L (ref 10–47)
AST: 29 U/L (ref 11–38)
Albumin: 3.8 g/dL (ref 3.3–5.5)
BUN, Bld: 14 mg/dL (ref 7–22)
CALCIUM: 9.1 mg/dL (ref 8.0–10.3)
CO2: 30 meq/L (ref 18–33)
Chloride: 104 mEq/L (ref 98–108)
Creat: 1.2 mg/dl (ref 0.6–1.2)
GLUCOSE: 95 mg/dL (ref 73–118)
POTASSIUM: 4.2 meq/L (ref 3.3–4.7)
Sodium: 137 mEq/L (ref 128–145)
Total Bilirubin: 0.7 mg/dl (ref 0.20–1.60)
Total Protein: 7.7 g/dL (ref 6.4–8.1)

## 2016-05-12 LAB — CBC WITH DIFFERENTIAL (CANCER CENTER ONLY)
BASO#: 0 10*3/uL (ref 0.0–0.2)
BASO%: 0.7 % (ref 0.0–2.0)
EOS ABS: 0.1 10*3/uL (ref 0.0–0.5)
EOS%: 2.5 % (ref 0.0–7.0)
HEMATOCRIT: 41.8 % (ref 38.7–49.9)
HGB: 14.4 g/dL (ref 13.0–17.1)
LYMPH#: 2 10*3/uL (ref 0.9–3.3)
LYMPH%: 35.8 % (ref 14.0–48.0)
MCH: 29.9 pg (ref 28.0–33.4)
MCHC: 34.4 g/dL (ref 32.0–35.9)
MCV: 87 fL (ref 82–98)
MONO#: 0.5 10*3/uL (ref 0.1–0.9)
MONO%: 8 % (ref 0.0–13.0)
NEUT#: 3 10*3/uL (ref 1.5–6.5)
NEUT%: 53 % (ref 40.0–80.0)
Platelets: 194 10*3/uL (ref 145–400)
RBC: 4.82 10*6/uL (ref 4.20–5.70)
RDW: 13.1 % (ref 11.1–15.7)
WBC: 5.6 10*3/uL (ref 4.0–10.0)

## 2016-05-12 LAB — PROTIME-INR (CHCC SATELLITE)
INR: 2.1 (ref 2.0–3.5)
Protime: 25.2 Seconds — ABNORMAL HIGH (ref 10.6–13.4)

## 2016-05-12 NOTE — Progress Notes (Signed)
Referral MD  Reason for Referral: Stage IB (T2N0M0) seminoma of the LEFT testicle             Protein S deficiency - recurrent DVT RIGHT leg  No chief complaint on file. : I just had mild left testicle removed.  HPI: [name] is well-known to me. He is a 50 year old white male. I have probably no him for about 10 years. I haven't seen him because he has a history of recurrent thrombus of the right leg. He has protein S deficiency. He is on long-term Coumadin. Connor Howell began to have some swelling and pain in the left testicle several months ago. He finally went to his family doctor. An ultrasound was done. This showed a  3.2 x 2.0 x 3.1 cm mass in the left testicle.  He then underwent orchiectomy. This was done on July 17. The pathology report [id number]) showed a pure seminoma. It measured 3.2 cm. All margins were negative. The tumor involves the rete testis. There was some lymphovascular invasion.  He was a stage IB (T2N0M) seminoma. Unfortunately, I do not know what his tumor markers showed.  He had a recent follow-up CT scan. This was done at his urologist office. The CT scan showed a solitary 1.1 cm left para-aortic retroperitoneal node.  He had a chest x-ray done on August 8. This was normal.  He has seen radiation oncology. He is here to see [place].  He feels well. His surgery went well. He's back to work. He works for [employer].  He's had no problems with his DVT. He's on Coumadin. His right leg does swell up on occasion.  He's had no abdominal pain. He's had no cough or shortness of breath. He's had no nausea or vomiting. His been no change in bowel or bladder habits.  Overall, his performance status is ECOG 0.   Past Medical History:  Diagnosis Date  . Anticoagulated on Coumadin   . Cancer ([place]) 03/21/2016   seminoma left testis/testicular cancer  . History of DVT of lower extremity    first dx 2001  right lower leg w/ recurrency   . Protein S deficiency ([place])   .  Testicular mass    LEFT  :  Past Surgical History:  Procedure Laterality Date  . KNEE ARTHROSCOPY W/ ACL RECONSTRUCTION Right 2003 approx  . ORCHIECTOMY Left 03/21/2016   Procedure: LEFT RADICAL ORCHIECTOMY;  Surgeon: [name], MD;  Location: [facility];  Service: Urology;  Laterality: Left;  :   Current Outpatient Prescriptions:  Marland Kitchen  Multiple Vitamin (MULTIVITAMIN) tablet, Take 1 tablet by mouth daily., Disp: , Rfl:  .  warfarin (COUMADIN) 5 MG tablet, TAKE 1 TABLET BY MOUTH EVERY DAY, Disp: 30 tablet, Rfl: 3:  :  Allergies  Allergen Reactions  . Epinephrine Other (See Comments)  :  Family History  Problem Relation Age of Onset  . Cancer Neg Hx   :  Social History   Social History  . Marital status: Married    Spouse name: N/A  . Number of children: N/A  . Years of education: N/A   Occupational History  . Not on file.   Social History Main Topics  . Smoking status: Never Smoker  . Smokeless tobacco: Never Used  . Alcohol use 0.0 oz/week     Comment: Moderate drinker  . Drug use: No  . Sexual activity: Yes   Other Topics Concern  . Not on file   Social History  Narrative  . No narrative on file  :  Pertinent items are noted in HPI.  Exam: @IPVITALS @ Well developed and well-nourished white male in no obvious distress. Vital signs show a temperature of 98.1. Pulse 63. Blood pressure 130/75. Weight is 208 pounds. Hent exam shows no ocular or oral lesions. There are no palpable cervical or supraclavicular lymph nodes. Lungs are clear. Cardiac exam regular rate and rhythm with no murmurs, rubs or bruits. Abdomen is soft. He has a healed left inguinal orchiectomy scar. There is no tenderness or guarding in the abdomen. He has no fluid wave. There is no palpable liver or spleen tip. Back exam shows no tenderness over the spine, ribs or hips. Externally shows no clubbing, cyanosis or edema. There may be some slight chronic nonpitting edema of  the right lower leg.    Recent Labs  05/12/16 1111  WBC 5.6  HGB 14.4  HCT 41.8  PLT 194    Recent Labs  05/12/16 1111  NA 137  K 4.2  CL 104  CO2 30  GLUCOSE 95  BUN 14  CREATININE 1.2  CALCIUM 9.1    Blood smear review:  None  Pathology: See above     Assessment and Plan:  [name] is a 50 year old white male. He has a. Seminoma of the left testicle.  I did send off tumor markers today. His LDH did come back on the high side. This might be an indicator that he does have systemic disease.  I will have to see what his beta hCG is. I would think that his alpha-fetoprotein will be normal.  This is a little bit of a tough situation. I think that if his tumor markers were all normal, that I would be inclined to do active surveillance. By that, I would do a CT of the abdomen and pelvis in about 3 months. I don't think we would need a chest x-ray.  Given that the LDH is elevated, this could indicate systemic disease. Again this is only a 1.1 cm node. I'll be surprised if this would be an issue for him.  The nice thing about seminomas is that they typically are very sensitive to treatment with radiation or chemotherapy.  I spent about 45 minutes with him. Thank you, I know him well. I just was not expecting that testicle cancer with can be a problem when I saw him.  We will see what his tumor markers show. I think if his beta-hCG and alpha-fetoprotein are normal, then I would wait to do another CT scan on him in November.  As far as his thrombus is concerned, his INR was provider today at 2.1.

## 2016-05-13 ENCOUNTER — Telehealth: Payer: Self-pay | Admitting: Radiation Oncology

## 2016-05-13 ENCOUNTER — Encounter: Payer: Self-pay | Admitting: Radiation Oncology

## 2016-05-13 LAB — AFP TUMOR MARKER: AFP, SERUM, TUMOR MARKER: 4 ng/mL (ref 0.0–8.3)

## 2016-05-13 LAB — BETA HCG QUANT (REF LAB): hCG Quant: <1 m[IU]/mL (ref 0–3)

## 2016-05-13 NOTE — Telephone Encounter (Signed)
I spoke with the patient today after his meeting with Dr. [name] on 05/11/16 and his appointment with Dr. Marin Olp. His case was also briefly discussed this morning at [facility], and the consensus was to move forward with treatment given the solid component of the periaortic lymph node. Based on this, his stage appears to be consistent with Stage IIA, T2, N1, cM0 disease. According to NCCN guidelines, Dr. [name] recommends moving forward with external radiation treatment to the periaortic and ipsilateral iliac nodes. The patient is interested in having this put in writing and also for our communication to include Dr. Marin Olp. I will send a copy of the recommendations to the patient as well, and follow up with him later next week.

## 2016-05-17 ENCOUNTER — Telehealth: Payer: Self-pay | Admitting: Radiation Oncology

## 2016-05-17 NOTE — Telephone Encounter (Signed)
I called to follow up with the patient to confirm that he knew the recommendations. He has not yet received my letter, and I'm waiting to hear back from Dr. Marin Olp regarding his thoughts on Dr. Johny Shears recommendations.

## 2016-05-25 ENCOUNTER — Other Ambulatory Visit: Payer: Self-pay

## 2016-05-25 DIAGNOSIS — I82409 Acute embolism and thrombosis of unspecified deep veins of unspecified lower extremity: Secondary | ICD-10-CM

## 2016-05-26 ENCOUNTER — Telehealth: Payer: Self-pay | Admitting: Radiation Oncology

## 2016-05-26 ENCOUNTER — Other Ambulatory Visit (HOSPITAL_BASED_OUTPATIENT_CLINIC_OR_DEPARTMENT_OTHER): Payer: 59

## 2016-05-26 DIAGNOSIS — I82401 Acute embolism and thrombosis of unspecified deep veins of right lower extremity: Secondary | ICD-10-CM

## 2016-05-26 DIAGNOSIS — Z7901 Long term (current) use of anticoagulants: Secondary | ICD-10-CM

## 2016-05-26 DIAGNOSIS — I82409 Acute embolism and thrombosis of unspecified deep veins of unspecified lower extremity: Secondary | ICD-10-CM

## 2016-05-26 LAB — PROTIME-INR (CHCC SATELLITE)
INR: 2.3 (ref 2.0–3.5)
Protime: 27.6 Seconds — ABNORMAL HIGH (ref 10.6–13.4)

## 2016-05-26 NOTE — Telephone Encounter (Signed)
I called and spoke with the patient to let him know that I was going to continue dialogue with Dr. Marin Olp and Dr. [name] about his case. He will keep [place] informed of any questions or concerns.

## 2016-06-05 DIAGNOSIS — Z86718 Personal history of other venous thrombosis and embolism: Secondary | ICD-10-CM | POA: Diagnosis not present

## 2016-06-05 DIAGNOSIS — Z9079 Acquired absence of other genital organ(s): Secondary | ICD-10-CM | POA: Diagnosis not present

## 2016-06-05 DIAGNOSIS — Z7901 Long term (current) use of anticoagulants: Secondary | ICD-10-CM | POA: Diagnosis not present

## 2016-06-05 DIAGNOSIS — D6859 Other primary thrombophilia: Secondary | ICD-10-CM | POA: Diagnosis not present

## 2016-06-05 DIAGNOSIS — Z888 Allergy status to other drugs, medicaments and biological substances status: Secondary | ICD-10-CM | POA: Diagnosis not present

## 2016-06-05 DIAGNOSIS — C6292 Malignant neoplasm of left testis, unspecified whether descended or undescended: Secondary | ICD-10-CM | POA: Diagnosis not present

## 2016-06-05 DIAGNOSIS — Z510 Encounter for antineoplastic radiation therapy: Secondary | ICD-10-CM | POA: Diagnosis present

## 2016-07-14 ENCOUNTER — Ambulatory Visit (HOSPITAL_BASED_OUTPATIENT_CLINIC_OR_DEPARTMENT_OTHER): Payer: 59 | Admitting: Hematology & Oncology

## 2016-07-14 ENCOUNTER — Other Ambulatory Visit (HOSPITAL_BASED_OUTPATIENT_CLINIC_OR_DEPARTMENT_OTHER): Payer: 59

## 2016-07-14 ENCOUNTER — Ambulatory Visit (HOSPITAL_BASED_OUTPATIENT_CLINIC_OR_DEPARTMENT_OTHER)
Admission: RE | Admit: 2016-07-14 | Discharge: 2016-07-14 | Disposition: A | Discharge status: Home / Self Care | Payer: 59 | Source: Ambulatory Visit | Attending: Hematology & Oncology | Admitting: Hematology & Oncology

## 2016-07-14 ENCOUNTER — Encounter (HOSPITAL_BASED_OUTPATIENT_CLINIC_OR_DEPARTMENT_OTHER): Payer: Self-pay

## 2016-07-14 VITALS — BP 125/85 | HR 66 | Temp 98.2°F | Resp 20 | Ht 70.0 in | Wt 209.1 lb

## 2016-07-14 DIAGNOSIS — Z9889 Other specified postprocedural states: Secondary | ICD-10-CM | POA: Diagnosis not present

## 2016-07-14 DIAGNOSIS — R591 Generalized enlarged lymph nodes: Secondary | ICD-10-CM | POA: Diagnosis not present

## 2016-07-14 DIAGNOSIS — C6292 Malignant neoplasm of left testis, unspecified whether descended or undescended: Secondary | ICD-10-CM

## 2016-07-14 DIAGNOSIS — C772 Secondary and unspecified malignant neoplasm of intra-abdominal lymph nodes: Secondary | ICD-10-CM | POA: Diagnosis not present

## 2016-07-14 DIAGNOSIS — I82401 Acute embolism and thrombosis of unspecified deep veins of right lower extremity: Secondary | ICD-10-CM

## 2016-07-14 DIAGNOSIS — Z86718 Personal history of other venous thrombosis and embolism: Secondary | ICD-10-CM

## 2016-07-14 DIAGNOSIS — K760 Fatty (change of) liver, not elsewhere classified: Secondary | ICD-10-CM | POA: Insufficient documentation

## 2016-07-14 DIAGNOSIS — K5730 Diverticulosis of large intestine without perforation or abscess without bleeding: Secondary | ICD-10-CM | POA: Insufficient documentation

## 2016-07-14 LAB — COMPREHENSIVE METABOLIC PANEL
ALT: 37 U/L (ref 0–55)
ANION GAP: 10 meq/L (ref 3–11)
AST: 31 U/L (ref 5–34)
Albumin: 4.2 g/dL (ref 3.5–5.0)
Alkaline Phosphatase: 83 U/L (ref 40–150)
BUN: 12.9 mg/dL (ref 7.0–26.0)
CALCIUM: 9.6 mg/dL (ref 8.4–10.4)
CHLORIDE: 102 meq/L (ref 98–109)
CO2: 27 meq/L (ref 22–29)
Creatinine: 0.9 mg/dL (ref 0.7–1.3)
EGFR: >90 mL/min/{1.73_m2} (ref >90)
Glucose: 100 mg/dl (ref 70–140)
POTASSIUM: 4.5 meq/L (ref 3.5–5.1)
Sodium: 139 mEq/L (ref 136–145)
Total Bilirubin: 0.61 mg/dL (ref 0.20–1.20)
Total Protein: 8.1 g/dL (ref 6.4–8.3)

## 2016-07-14 LAB — CBC WITH DIFFERENTIAL (CANCER CENTER ONLY)
BASO#: 0 10*3/uL (ref 0.0–0.2)
BASO%: 0.4 % (ref 0.0–2.0)
EOS%: 1.7 % (ref 0.0–7.0)
Eosinophils Absolute: 0.1 10*3/uL (ref 0.0–0.5)
HEMATOCRIT: 44.5 % (ref 38.7–49.9)
HGB: 15.1 g/dL (ref 13.0–17.1)
LYMPH#: 1.5 10*3/uL (ref 0.9–3.3)
LYMPH%: 28.5 % (ref 14.0–48.0)
MCH: 29.8 pg (ref 28.0–33.4)
MCHC: 33.9 g/dL (ref 32.0–35.9)
MCV: 88 fL (ref 82–98)
MONO#: 0.6 10*3/uL (ref 0.1–0.9)
MONO%: 10.8 % (ref 0.0–13.0)
NEUT#: 3.1 10*3/uL (ref 1.5–6.5)
NEUT%: 58.6 % (ref 40.0–80.0)
PLATELETS: 184 10*3/uL (ref 145–400)
RBC: 5.07 10*6/uL (ref 4.20–5.70)
RDW: 12.9 % (ref 11.1–15.7)
WBC: 5.2 10*3/uL (ref 4.0–10.0)

## 2016-07-14 LAB — PROTIME-INR (CHCC SATELLITE)
INR: 1.9 — AB (ref 2.0–3.5)
PROTIME: 22.8 s — AB (ref 10.6–13.4)

## 2016-07-14 LAB — LACTATE DEHYDROGENASE: LDH: 262 U/L — ABNORMAL HIGH (ref 125–245)

## 2016-07-14 MED ORDER — IOPAMIDOL (ISOVUE-300) INJECTION 61%
100.0000 mL | Freq: Once | INTRAVENOUS | Status: AC | PRN
  Administered 2016-07-14: 100 mL via INTRAVENOUS

## 2016-07-14 NOTE — Progress Notes (Signed)
Hematology and Oncology Follow Up Visit  Veniamin Weigman [id number] 24-Jan-1966 50 y.o. 07/14/2016   Principle Diagnosis:   Stage II seminoma of the left testicle  Recurrent DVT of the right leg  Protein S deficiency  Current Therapy:    Observation  Coumadin to obtain INR between 2-3.     Interim History:  Mr. Rydin Braden is back for follow-up. We did do a CT scan on his abdomen and pelvis today. Unfortunately, the left periaortic lymph node is now 2.1 cm. A previously had been 1 cm. I think this is indicative of disease in this lymph node.  Otherwise, he has had no problems. He is still working full-time. He's had no abdominal pain. He's had no problems with bowels or bladder. He's had no cough or shortness of breath. He's had no rashes.  He's had no fatigue or weakness. He's had no weight loss or weight gain. His appetite has been good.  Overall, I see that his performance status has been quite good. Medications:  Current Outpatient Prescriptions:  Marland Kitchen  Multiple Vitamin (MULTIVITAMIN) tablet, Take 1 tablet by mouth daily., Disp: , Rfl:  .  warfarin (COUMADIN) 5 MG tablet, TAKE 1 TABLET BY MOUTH EVERY DAY, Disp: 30 tablet, Rfl: 3  Allergies:  Allergies  Allergen Reactions  . Epinephrine Other (See Comments)    Past Medical History, Surgical history, Social history, and Family History were reviewed and updated.  Review of Systems: As above  Physical Exam:  height is 5\' 10"  (1.778 m) and weight is 209 lb 2.2 oz (94.9 kg). His oral temperature is 98.2 F (36.8 C). His blood pressure is 125/85 and his pulse is 66. His respiration is 20.   Wt Readings from Last 3 Encounters:  07/14/16 209 lb 2.2 oz (94.9 kg)  05/12/16 208 lb (94.3 kg)  05/11/16 210 lb 14.4 oz (95.7 kg)      Well-developed and well-nourished white male in no obvious distress. Head and neck exam shows no ocular or oral lesions. There are no palpable cervical or supraclavicular lymph nodes. Lungs are  clear. Cardiac exam regular rate and rhythm with no murmurs, rubs or bruits. Abdomen is soft. He has good bowel sounds. There is no fluid wave. He has a well-healed left orchiectomy scar. He has no guarding or rebound tenderness. He has no palpable liver or spleen tip. Back exam shows no tenderness over the spine, ribs or hips. Extremities shows some chronic nonpitting edema of the right lower leg. No venous cord is noted. He has a negative Homans sign. Skin exam shows no rashes, ecchymoses or petechia. Neurological exam is nonfocal.  Lab Results  Component Value Date   WBC 5.2 07/14/2016   HGB 15.1 07/14/2016   HCT 44.5 07/14/2016   MCV 88 07/14/2016   PLT 184 07/14/2016     Chemistry      Component Value Date/Time   NA 139 07/14/2016 0910   K 4.5 07/14/2016 0910   CL 104 05/12/2016 1111   CO2 27 07/14/2016 0910   BUN 12.9 07/14/2016 0910   CREATININE 0.9 07/14/2016 0910      Component Value Date/Time   CALCIUM 9.6 07/14/2016 0910   ALKPHOS 83 07/14/2016 0910   AST 31 07/14/2016 0910   ALT 37 07/14/2016 0910   BILITOT 0.61 07/14/2016 0910     LDH is 262    Impression and Plan: Mr. Bassel Rudisill is a 50 year old white male. He obviously now has stage II seminoma of  the left testicle. When we last saw him back in July, I just was not convinced that the lymph node in the left periaortic region was malignant. I do not want to expose him to chemotherapy or radiation unless I was convinced. I think that I am now convinced. His LDH is the only tumor marker that is elevated.  I talked him about radiation and chemotherapy. I think since he has small volume disease that is localized, I really think that radiation therapy would be highly reasonable. I think that given that his tumor is seminoma, this should be very sensitive to radiation.  If, he does have a relapse after radiation, salvage chemotherapy would still be highly effective.  I just feel bad for [name]. I just hate that he has  to have therapy for this. However, this is one of the most curable cancers that we have and I was very encouraging when I talked to him about treatment.  I spoke with radiation oncology today. They will make an appointment for him and talk to him about radiation.  We certainly could do chemotherapy but I just think that given the small volume of his disease, that radiation would be very appropriate and very likely to cure him.  His INR today was 1.9. I would not change his Coumadin dose.  I spent about 45 minutes with him. I felt bad for him. I know that he was upset that he had to have treatment. I certainly understand this.  I would like to see him back in 2 months. By then, he should be done with treatment.  [name], MD 11/9/20175:36 PM

## 2016-07-15 ENCOUNTER — Telehealth: Payer: Self-pay | Admitting: *Deleted

## 2016-07-15 LAB — AFP TUMOR MARKER: AFP, Serum, Tumor Marker: 3.8 ng/mL (ref 0.0–8.3)

## 2016-07-15 LAB — BETA HCG QUANT (REF LAB): hCG Quant: <1 m[IU]/mL (ref 0–3)

## 2016-07-15 NOTE — Telephone Encounter (Signed)
CALLED PATIENT TO INFORM OF [facility] APPT. AND HIS SIM ON 07-25-16, SPOKE WITH PATIENT AND HE IS AWARE OF THIS APPT.

## 2016-07-25 ENCOUNTER — Ambulatory Visit
Admission: RE | Admit: 2016-07-25 | Discharge: 2016-07-25 | Disposition: A | Discharge status: Home / Self Care | Payer: 59 | Source: Ambulatory Visit | Attending: Radiation Oncology | Admitting: Radiation Oncology

## 2016-07-25 ENCOUNTER — Other Ambulatory Visit: Payer: Self-pay | Admitting: Radiation Oncology

## 2016-07-25 ENCOUNTER — Encounter: Payer: Self-pay | Admitting: Radiation Oncology

## 2016-07-25 DIAGNOSIS — Z510 Encounter for antineoplastic radiation therapy: Secondary | ICD-10-CM | POA: Diagnosis not present

## 2016-07-25 DIAGNOSIS — C6292 Malignant neoplasm of left testis, unspecified whether descended or undescended: Secondary | ICD-10-CM

## 2016-07-25 DIAGNOSIS — C6212 Malignant neoplasm of descended left testis: Secondary | ICD-10-CM

## 2016-07-25 MED ORDER — PROCHLORPERAZINE MALEATE 10 MG PO TABS
ORAL_TABLET | ORAL | 0 refills | Status: DC
Start: 2016-07-25 — End: 2016-09-15

## 2016-07-25 NOTE — Progress Notes (Signed)
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 Seminoma of left testis, stage 2 (HCC) 186.9 C62.92     HISTORY OF PRESENT ILLNESS: Connor Howell is a 50 y.o.  male who presented with scrotal swelling and a palpable mass of the testicle on the left side. This was present for about 2 months prior to his presentation when he noticed growth of this. He did not have testicular pain on the side of his mass. His mass does not cause restriction of normal activities. He has not had injuries to the testicles or scrotum. He had scrotal ultrasound on 03/11/16 showing 3.2 x 2.0 x 3.1 cm solid mass of the left testicle demonstrated blood flow and micro calcifications. This was consistent with testicular malignancy.  Right testicle was normal.  He proceeded to left radical orchiectomy with Dr. Pilar Jarvis on 03/21/16.  Pathology showed a 3.2 cm seminoma with LVI and involvement of the rete testis.  CT abdomen/pelvis on 04/12/16 showed an isolated 1.1 cm left retroperitoneal node suspicious for isolated nodal metastasis.     When he met with Dr. [name] on 05/11/16, recommendations were made for treatment of his Stage IIA disease. He elected to proceed with repeat imaging, and on 07/14/16, a repeat CT a/p was performed and revealed left periaortic adenopathy which had increased in size to 21 mm previously 68m. No additional concerns for additional metastases was noted. He comes today to discuss the role of radiotherapy in the treatment of his nodal disease.   On review of systems, the patient reports that he is doing well overall. He denies any chest pain, shortness of breath, cough, fevers, chills, night sweats, unintended  weight changes. He denies any bowel or bladder disturbances, and denies abdominal pain, nausea or vomiting. He denies any new musculoskeletal or joint aches or pains. A complete review of systems is obtained and is otherwise negative.  PREVIOUS RADIATION THERAPY: No  Past Medical History:  Past Medical History:  Diagnosis Date  . Anticoagulated on Coumadin   . [place]   seminoma left testis/testicular cancer  . History of DVT of lower extremity    first dx 2001  right lower leg w/ recurrency   . [place] S [place]   . Testicular mass    LEFT    Past Surgical History: Past Surgical History:  Procedure Laterality Date  . KNEE ARTHROSCOPY W/ ACL RECONSTRUCTION Right 2003 approx  . ORCHIECTOMY Left 03/21/2016   Procedure: LEFT RADICAL ORCHIECTOMY;  Surgeon: BNickie Retort MD;  [facility]  Service: Urology;  Laterality: Left;    Social History:  Social History   Social History  . Marital status: Married    Spouse name: N/A  . Number of children: N/A  . Years of education: N/A   Occupational History  . Not on file.   Social History Main Topics  . Smoking status: Never Smoker  . Smokeless tobacco: Never Used  . Alcohol use 0.0 oz/week     Comment: Moderate drinker  . Drug use: No  . Sexual activity: Yes   Other Topics Concern  . Not on file   Social History  Narrative  . No narrative on file    Family History: Family History  Problem Relation Age of Onset  . Cancer Neg Hx    Allergies:  Allergies  Allergen Reactions  . Epinephrine Other (See Comments)   Medications:  Current Outpatient Prescriptions:  Marland Kitchen  Multiple Vitamin (MULTIVITAMIN) tablet, Take 1 tablet by mouth daily., Disp: , Rfl:  .  warfarin (COUMADIN) 5 MG tablet, TAKE 1 TABLET BY MOUTH EVERY DAY, Disp: 30 tablet, Rfl: 3    PHYSICAL EXAM:  BP 137/87 (BP Location: Right Arm, Patient Position: Sitting, Cuff Size: Normal)   Pulse 68   Resp 16    Ht '5\' 10"'  (1.778 m)   Wt 206 lb (93.4 kg)   SpO2 100%   BMI 29.56 kg/m  In general this is a well appearing caucasian male in no acute distress. He's alert and oriented x4 and appropriate throughout the examination. Cardiopulmonary assessment is negative for acute distress and he exhibits normal effort.  KPS = 100  100 - Normal; no complaints; no evidence of disease. 90   - Able to carry on normal activity; minor signs or symptoms of disease. 80   - Normal activity with effort; some signs or symptoms of disease. 37   - Cares for self; unable to carry on normal activity or to do active work. 60   - Requires occasional assistance, but is able to care for most of his personal needs. 50   - Requires considerable assistance and frequent medical care. 2   - Disabled; requires special care and assistance. 64   - Severely disabled; hospital admission is indicated although death not imminent. 49   - Very sick; hospital admission necessary; active supportive treatment necessary. 10   - Moribund; fatal processes progressing rapidly. 0     - Dead  Karnofsky DA, Abelmann Gratz, Craver LS and Burchenal [facility] [phone]) The use of the nitrogen mustards in the palliative treatment of carcinoma: with particular reference to bronchogenic carcinoma Cancer 1 634-56  LABORATORY DATA:  Lab Results  Component Value Date   WBC 5.5 03/21/2016   HGB 14.9 03/21/2016   HCT 43.5 03/21/2016   MCV 88.2 03/21/2016   PLT 205 03/21/2016   Lab Results  Component Value Date   NA 138 03/21/2016   K 4.0 03/21/2016   CL 107 03/21/2016   CO2 25 03/21/2016   No results found for: ALT, AST, GGT, ALKPHOS, BILITOT   RADIOGRAPHY: Dg Chest 2 View  Result Date: 04/12/2016 CLINICAL DATA:  Testicular cancer. EXAM: CHEST  2 VIEW COMPARISON:  01/08/2006. FINDINGS: Mediastinum hilar structures normal. No acute infiltrate. Left base pleural parenchymal thickening again noted consistent with scarring. Stable elevation left  hemidiaphragm. Oral contrast in the colon. No acute bony abnormality. IMPRESSION: Left base pleural-parenchymal thickening again noted consistent scarring. No acute cardiopulmonary disease. Electronically Signed   By: Marcello Moores  Register   On: 04/12/2016 13:07      Impression/Plan:  34. 50 yo man with Stage IIA, pT2 N1 M0 seminoma of the left testis. Dr. [name] again reviews the options and recommendations for active treatment. He remains eligible for radiotherapy to 30 Gy to para-aortic and left iliac nodes.  If he had multiple positive lymph nodes, the NCCN guidelines would also recommend radiation versus primary chemotherapy as displayed in the NCCN algorithm screenshotted below. Dr. [name] reviews the risks, benefits, short, and long term effects of radiotherapy, and the patient is interested in moving forward with treatment. We have scheduled  his simulation already to follow today's visit. He has signed written consent and would like to move forward.  Again screen shotted below is a summary of NCCN recommendations for treatment and surveillance. In a visit lasting 25 minutes, greater than 50% of the time was spent face to face discussing treatment logistics, and coordinating the patient's care.  The above documentation reflects my direct findings during this shared patient visit. Please see the separate note by Dr. [name] on this date for the remainder of the patient's plan of care.     Carola Rhine, [facility]       Surveillance after treatment by NCCN:    This document serves as a record of services personally performed by Shona Simpson, PAC and  Tyler Pita, MD. It was created on their behalf by Bethann Humble, a trained medical scribe. The creation of this record is based on the scribe's personal observations and the provider's statements to them. This document has been checked and approved by the attending provider.

## 2016-07-25 NOTE — Progress Notes (Signed)
See progress note under physician encounter. 

## 2016-07-25 NOTE — Progress Notes (Signed)
GU Location of Tumor / Histology: Seminoma left testicle with a 1.1 cm left paraaortic lymph node  Connor Howell presented to Dr. Pilar Jarvis on March 14, 2016 with swelling in his scrotum. Patient reports his PCP referred him to Dr. Pilar Jarvis following his yearly physical for further evaluation of the swelling in his scrotum. Reported he noticed a testicular mass two months prior. Explained the mass had grown in size.    Past/Anticipated interventions by urology, if any: left inguinal radical orchiectomy on 03/21/16  Past/Anticipated interventions by medical oncology, if any: will follow up with Dr. Marin Olp to arrange for possible future chemotherapy. Patient reports he sees Dr. Marin Olp routinely for a blood disorder thus, he is very familiar with him. Repeat CT imaging confirmed tumor growth. Patient returns today to begin XRT.  Weight changes, if any: no  Bowel/Bladder complaints, if any: no   Nausea/Vomiting, if any: no  Pain issues, if any:  no  SAFETY ISSUES:  Prior radiation? no  Pacemaker/ICD? no  Possible current pregnancy? no  Is the patient on methotrexate? no  Current Complaints / other details:  50 year old male. Married. Works full time. Simulation scheduled for 2 pm today.

## 2016-07-25 NOTE — Progress Notes (Signed)
  Radiation Oncology         [phone] ________________________________  Name: Connor Howell MRN: [id number]  Date: 07/25/2016  DOB: 23-Sep-1965  SIMULATION AND TREATMENT PLANNING NOTE    ICD-9-CM ICD-10-CM   1. Seminoma of left testis, stage 2 (HCC) 186.9 C62.92     DIAGNOSIS:  50 yo man with stage IIB seminoma  NARRATIVE:  The patient was brought to the [facility].  Identity was confirmed.  All relevant records and images related to the planned course of therapy were reviewed.  The patient freely provided informed written consent to proceed with treatment after reviewing the details related to the planned course of therapy. The consent form was witnessed and verified by the simulation staff.  Then, the patient was set-up in a stable reproducible  supine position for radiation therapy.  CT images were obtained.  Surface markings were placed.  The CT images were loaded into the planning software.  Then the target and avoidance structures were contoured.  Treatment planning then occurred.  The radiation prescription was entered and confirmed.  Then, I designed and supervised the construction of a total of 3 medically necessary complex treatment devices.  I have requested : 3D Simulation  I have requested a DVH of the following structures: left kidney, right kidney, small bowel, spinal cord and target volumes.  I have ordered:Nutrition Consult  PLAN:  The patient will receive 36 Gy in 18 fraction with cone down after 20 Gy  ________________________________  Sheral Apley. [name], M.D.

## 2016-08-03 ENCOUNTER — Ambulatory Visit
Admission: RE | Admit: 2016-08-03 | Discharge: 2016-08-03 | Disposition: A | Discharge status: Home / Self Care | Payer: 59 | Source: Ambulatory Visit | Attending: Radiation Oncology | Admitting: Radiation Oncology

## 2016-08-03 DIAGNOSIS — Z510 Encounter for antineoplastic radiation therapy: Secondary | ICD-10-CM | POA: Diagnosis not present

## 2016-08-04 ENCOUNTER — Ambulatory Visit
Admission: RE | Admit: 2016-08-04 | Discharge: 2016-08-04 | Disposition: A | Discharge status: Home / Self Care | Payer: 59 | Source: Ambulatory Visit | Attending: Radiation Oncology | Admitting: Radiation Oncology

## 2016-08-04 DIAGNOSIS — Z510 Encounter for antineoplastic radiation therapy: Secondary | ICD-10-CM | POA: Diagnosis not present

## 2016-08-05 ENCOUNTER — Ambulatory Visit
Admission: RE | Admit: 2016-08-05 | Discharge: 2016-08-05 | Disposition: A | Discharge status: Home / Self Care | Payer: 59 | Source: Ambulatory Visit | Attending: Radiation Oncology | Admitting: Radiation Oncology

## 2016-08-05 VITALS — BP 133/88 | HR 67 | Resp 16 | Wt 207.8 lb

## 2016-08-05 DIAGNOSIS — C6292 Malignant neoplasm of left testis, unspecified whether descended or undescended: Secondary | ICD-10-CM | POA: Diagnosis not present

## 2016-08-05 DIAGNOSIS — Z510 Encounter for antineoplastic radiation therapy: Secondary | ICD-10-CM | POA: Diagnosis present

## 2016-08-05 DIAGNOSIS — Z888 Allergy status to other drugs, medicaments and biological substances status: Secondary | ICD-10-CM | POA: Diagnosis not present

## 2016-08-05 DIAGNOSIS — Z7901 Long term (current) use of anticoagulants: Secondary | ICD-10-CM | POA: Diagnosis not present

## 2016-08-05 DIAGNOSIS — Z9079 Acquired absence of other genital organ(s): Secondary | ICD-10-CM | POA: Diagnosis not present

## 2016-08-05 DIAGNOSIS — D6859 Other primary thrombophilia: Secondary | ICD-10-CM | POA: Diagnosis not present

## 2016-08-05 DIAGNOSIS — Z86718 Personal history of other venous thrombosis and embolism: Secondary | ICD-10-CM | POA: Diagnosis not present

## 2016-08-05 NOTE — Progress Notes (Signed)
  Radiation Oncology         [phone]   Name: Connor Howell MRN: [id number]   Date: 08/05/2016  DOB: [birth year]   Weekly Radiation Therapy Management    ICD-9-CM ICD-10-CM   1. Seminoma of left testis, stage 2 (HCC) 186.9 C62.92     Current Dose: 4 Gy  Planned Dose:  36 Gy  Narrative The patient presents for routine under treatment assessment.  The patient denies pain, bowel or bladder complaints, nausea, vomiting, or fatigue.  Set-up films were reviewed. The chart was checked.  Physical Findings  weight is 207 lb 12.8 oz (94.3 kg). His blood pressure is 133/88 and his pulse is 67. His respiration is 16 and oxygen saturation is 100%. . Weight essentially stable.  No significant changes.  Impression The patient is tolerating radiation.  Plan Continue treatment as planned.         Sheral Apley [name], M.D.  This document serves as a record of services personally performed by Tyler Pita, MD. It was created on his behalf by Darcus Austin, a trained medical scribe. The creation of this record is based on the scribe's personal observations and the provider's statements to them. This document has been checked and approved by the attending provider.

## 2016-08-05 NOTE — Progress Notes (Signed)
Weight and vitals stable. Denies pain. Denies any bowel or bladder complaints. Denies nausea or vomiting. Denies fatigue.        BP 133/88 (BP Location: Left Arm, Patient Position: Sitting, Cuff Size: Normal)   Pulse 67   Resp 16   Wt 207 lb 12.8 oz (94.3 kg)   SpO2 100%   BMI 29.82 kg/m  Wt Readings from Last 3 Encounters:  08/05/16 207 lb 12.8 oz (94.3 kg)  07/25/16 206 lb (93.4 kg)  07/14/16 209 lb 2.2 oz (94.9 kg)

## 2016-08-05 NOTE — Addendum Note (Signed)
Encounter addended by: Heywood Footman, RN on: 08/05/2016  5:07 PM<BR>    Actions taken: Patient Education assessment filed

## 2016-08-08 ENCOUNTER — Ambulatory Visit
Admission: RE | Admit: 2016-08-08 | Discharge: 2016-08-08 | Disposition: A | Discharge status: Home / Self Care | Payer: 59 | Source: Ambulatory Visit | Attending: Radiation Oncology | Admitting: Radiation Oncology

## 2016-08-08 DIAGNOSIS — Z510 Encounter for antineoplastic radiation therapy: Secondary | ICD-10-CM | POA: Diagnosis not present

## 2016-08-09 ENCOUNTER — Ambulatory Visit
Admission: RE | Admit: 2016-08-09 | Discharge: 2016-08-09 | Disposition: A | Discharge status: Home / Self Care | Payer: 59 | Source: Ambulatory Visit | Attending: Radiation Oncology | Admitting: Radiation Oncology

## 2016-08-09 DIAGNOSIS — C6292 Malignant neoplasm of left testis, unspecified whether descended or undescended: Secondary | ICD-10-CM | POA: Insufficient documentation

## 2016-08-09 DIAGNOSIS — Z510 Encounter for antineoplastic radiation therapy: Secondary | ICD-10-CM | POA: Insufficient documentation

## 2016-08-10 ENCOUNTER — Ambulatory Visit
Admission: RE | Admit: 2016-08-10 | Discharge: 2016-08-10 | Disposition: A | Discharge status: Home / Self Care | Payer: 59 | Source: Ambulatory Visit | Attending: Radiation Oncology | Admitting: Radiation Oncology

## 2016-08-10 DIAGNOSIS — Z510 Encounter for antineoplastic radiation therapy: Secondary | ICD-10-CM | POA: Diagnosis not present

## 2016-08-11 ENCOUNTER — Ambulatory Visit
Admission: RE | Admit: 2016-08-11 | Discharge: 2016-08-11 | Disposition: A | Discharge status: Home / Self Care | Payer: 59 | Source: Ambulatory Visit | Attending: Radiation Oncology | Admitting: Radiation Oncology

## 2016-08-11 VITALS — BP 129/98 | HR 72 | Resp 16 | Wt 207.0 lb

## 2016-08-11 DIAGNOSIS — Z510 Encounter for antineoplastic radiation therapy: Secondary | ICD-10-CM | POA: Diagnosis not present

## 2016-08-11 DIAGNOSIS — C6292 Malignant neoplasm of left testis, unspecified whether descended or undescended: Secondary | ICD-10-CM

## 2016-08-11 NOTE — Progress Notes (Signed)
Weight stable. Diastolic BP elevated. Reports bowels are loose. Reports nausea. Denies taking antiemetic because of potential side effects. Denies any bladder complaints. Denies fatigue.  BP (!) 129/98   Pulse 72   Resp 16   Wt 207 lb (93.9 kg)   SpO2 100%   BMI 29.70 kg/m  Wt Readings from Last 3 Encounters:  08/11/16 207 lb (93.9 kg)  08/05/16 207 lb 12.8 oz (94.3 kg)  07/25/16 206 lb (93.4 kg)

## 2016-08-11 NOTE — Progress Notes (Signed)
  Radiation Oncology         [phone]   Name: Connor Howell MRN: [id number]   Date: 08/11/2016  DOB: 02-03-1966   Weekly Radiation Therapy Management    ICD-9-CM ICD-10-CM   1. Seminoma of left testis, stage 2 (HCC) 186.9 C62.92     Current Dose: 12 Gy  Planned Dose:  36 Gy  Narrative The patient presents for routine under treatment assessment.  Weight stable. Diastolic BP elevated. Reports bowels are loose. Reports nausea. Denies taking antiemetic because of side effects. Denies any bladder complaints. Denies fatigue.   Set-up films were reviewed. The chart was checked.  Physical Findings  weight is 207 lb (93.9 kg). His blood pressure is 129/98 (abnormal) and his pulse is 72. His respiration is 16 and oxygen saturation is 100%. . Weight essentially stable.  No significant changes.  Impression The patient is tolerating radiation. He is not interested in antiemetic prescription at this time. We will continue to monitor nausea symptoms. He will contact our clinic if he'd like a prescription.  Plan Continue treatment as planned.       Sheral Apley [name], M.D.  This document serves as a record of services personally performed by Tyler Pita, MD and Shona Simpson, PA-C. It was created on his behalf by Arlyce Harman, a trained medical scribe. The creation of this record is based on the scribe's personal observations and the provider's statements to them. This document has been checked and approved by the attending provider.

## 2016-08-12 ENCOUNTER — Ambulatory Visit
Admission: RE | Admit: 2016-08-12 | Discharge: 2016-08-12 | Disposition: A | Discharge status: Home / Self Care | Payer: 59 | Source: Ambulatory Visit | Attending: Radiation Oncology | Admitting: Radiation Oncology

## 2016-08-12 DIAGNOSIS — Z510 Encounter for antineoplastic radiation therapy: Secondary | ICD-10-CM | POA: Diagnosis not present

## 2016-08-15 ENCOUNTER — Ambulatory Visit
Admission: RE | Admit: 2016-08-15 | Discharge: 2016-08-15 | Disposition: A | Discharge status: Home / Self Care | Payer: 59 | Source: Ambulatory Visit | Attending: Radiation Oncology | Admitting: Radiation Oncology

## 2016-08-15 DIAGNOSIS — Z510 Encounter for antineoplastic radiation therapy: Secondary | ICD-10-CM | POA: Diagnosis not present

## 2016-08-16 ENCOUNTER — Ambulatory Visit
Admission: RE | Admit: 2016-08-16 | Discharge: 2016-08-16 | Disposition: A | Discharge status: Home / Self Care | Payer: 59 | Source: Ambulatory Visit | Attending: Radiation Oncology | Admitting: Radiation Oncology

## 2016-08-16 DIAGNOSIS — Z510 Encounter for antineoplastic radiation therapy: Secondary | ICD-10-CM | POA: Diagnosis not present

## 2016-08-17 ENCOUNTER — Ambulatory Visit
Admission: RE | Admit: 2016-08-17 | Discharge: 2016-08-17 | Disposition: A | Discharge status: Home / Self Care | Payer: 59 | Source: Ambulatory Visit | Attending: Radiation Oncology | Admitting: Radiation Oncology

## 2016-08-17 DIAGNOSIS — Z510 Encounter for antineoplastic radiation therapy: Secondary | ICD-10-CM | POA: Diagnosis not present

## 2016-08-18 ENCOUNTER — Ambulatory Visit
Admission: RE | Admit: 2016-08-18 | Discharge: 2016-08-18 | Disposition: A | Discharge status: Home / Self Care | Payer: 59 | Source: Ambulatory Visit | Attending: Radiation Oncology | Admitting: Radiation Oncology

## 2016-08-18 DIAGNOSIS — Z510 Encounter for antineoplastic radiation therapy: Secondary | ICD-10-CM | POA: Diagnosis not present

## 2016-08-19 ENCOUNTER — Ambulatory Visit
Admission: RE | Admit: 2016-08-19 | Discharge: 2016-08-19 | Disposition: A | Discharge status: Home / Self Care | Payer: 59 | Source: Ambulatory Visit | Attending: Radiation Oncology | Admitting: Radiation Oncology

## 2016-08-19 VITALS — BP 132/86 | HR 70 | Resp 16 | Wt 210.6 lb

## 2016-08-19 DIAGNOSIS — Z510 Encounter for antineoplastic radiation therapy: Secondary | ICD-10-CM | POA: Diagnosis not present

## 2016-08-19 DIAGNOSIS — C6292 Malignant neoplasm of left testis, unspecified whether descended or undescended: Secondary | ICD-10-CM

## 2016-08-19 NOTE — Progress Notes (Signed)
  Radiation Oncology         [phone]   Name: Connor Howell MRN: [id number]   Date: 08/19/2016  DOB: [birth year]   Weekly Radiation Therapy Management    ICD-9-CM ICD-10-CM   1. Seminoma of left testis, stage 2 (HCC) 186.9 C62.92     Current Dose: 24 Gy  Planned Dose:  36 Gy  Narrative The patient presents for routine under treatment assessment.  Reports bowels continue to be loose, but not at the point to be considered as diarrhea. Reports taking compazine prior to xrt to prevent nausea or vomiting. Denies pain, dysuria, hematuria, nocturia, frequency or urgency. Reports increased fatigue. Denies skin reaction in the treatment area. Flat affect noted today by the nurse.  Set-up films were reviewed. The chart was checked.  Physical Findings  weight is 210 lb 9.6 oz (95.5 kg). His blood pressure is 132/86 and his pulse is 70. His respiration is 16 and oxygen saturation is 100%. . Weight essentially stable.  No significant changes.  Impression The patient is tolerating radiation.  Plan Continue treatment as planned.       Sheral Apley [name], M.D.  This document serves as a record of services personally performed by Tyler Pita, MD. It was created on his behalf by Darcus Austin, a trained medical scribe. The creation of this record is based on the scribe's personal observations and the provider's statements to them. This document has been checked and approved by the attending provider.

## 2016-08-19 NOTE — Progress Notes (Signed)
Weight and vitals stable. Denies pain. Reports bowels continue to be loose. Reports taking compazine prior to xrt to prevent nausea or vomiting. Denies dysuria, hematuria, nocturia, frequency or urgency. Reports increased fatigue. Flat affect noted today. Denies needs.   BP 132/86 (BP Location: Left Arm, Patient Position: Sitting, Cuff Size: Normal)   Pulse 70   Resp 16   Wt 210 lb 9.6 oz (95.5 kg)   SpO2 100%   BMI 30.22 kg/m  Wt Readings from Last 3 Encounters:  08/19/16 210 lb 9.6 oz (95.5 kg)  08/11/16 207 lb (93.9 kg)  08/05/16 207 lb 12.8 oz (94.3 kg)

## 2016-08-22 ENCOUNTER — Ambulatory Visit
Admission: RE | Admit: 2016-08-22 | Discharge: 2016-08-22 | Disposition: A | Discharge status: Home / Self Care | Payer: 59 | Source: Ambulatory Visit | Attending: Radiation Oncology | Admitting: Radiation Oncology

## 2016-08-22 DIAGNOSIS — Z510 Encounter for antineoplastic radiation therapy: Secondary | ICD-10-CM | POA: Diagnosis not present

## 2016-08-23 ENCOUNTER — Ambulatory Visit
Admission: RE | Admit: 2016-08-23 | Discharge: 2016-08-23 | Disposition: A | Discharge status: Home / Self Care | Payer: 59 | Source: Ambulatory Visit | Attending: Radiation Oncology | Admitting: Radiation Oncology

## 2016-08-23 DIAGNOSIS — Z510 Encounter for antineoplastic radiation therapy: Secondary | ICD-10-CM | POA: Diagnosis not present

## 2016-08-23 NOTE — Progress Notes (Signed)
Patient presents to the clinic today reporting increased flatus and abdominal cramping. Encouraged patient to begin using OTC Gas X. Patient verbalized understanding. Patient will inform this RN if he doesn't receive relief.

## 2016-08-24 ENCOUNTER — Ambulatory Visit
Admission: RE | Admit: 2016-08-24 | Discharge: 2016-08-24 | Disposition: A | Discharge status: Home / Self Care | Payer: 59 | Source: Ambulatory Visit | Attending: Radiation Oncology | Admitting: Radiation Oncology

## 2016-08-24 DIAGNOSIS — Z510 Encounter for antineoplastic radiation therapy: Secondary | ICD-10-CM | POA: Diagnosis not present

## 2016-08-25 ENCOUNTER — Ambulatory Visit
Admission: RE | Admit: 2016-08-25 | Discharge: 2016-08-25 | Disposition: A | Discharge status: Home / Self Care | Payer: 59 | Source: Ambulatory Visit | Attending: Radiation Oncology | Admitting: Radiation Oncology

## 2016-08-25 DIAGNOSIS — Z510 Encounter for antineoplastic radiation therapy: Secondary | ICD-10-CM | POA: Diagnosis not present

## 2016-08-26 ENCOUNTER — Ambulatory Visit
Admission: RE | Admit: 2016-08-26 | Discharge: 2016-08-26 | Disposition: A | Discharge status: Home / Self Care | Payer: 59 | Source: Ambulatory Visit | Attending: Radiation Oncology | Admitting: Radiation Oncology

## 2016-08-26 VITALS — BP 123/77 | HR 89 | Resp 18 | Wt 212.2 lb

## 2016-08-26 DIAGNOSIS — Z510 Encounter for antineoplastic radiation therapy: Secondary | ICD-10-CM | POA: Diagnosis not present

## 2016-08-26 DIAGNOSIS — C6292 Malignant neoplasm of left testis, unspecified whether descended or undescended: Secondary | ICD-10-CM

## 2016-08-26 NOTE — Progress Notes (Signed)
  Radiation Oncology         [phone]   Name: Connor Howell MRN: [id number]   Date: 08/26/2016  DOB: [birth year]   Weekly Radiation Therapy Management    ICD-9-CM ICD-10-CM   1. Seminoma of left testis, stage 2 (HCC) 186.9 C62.92     Current Dose: 34 Gy  Planned Dose:  36 Gy  Narrative The patient presents for routine under treatment assessment.  Weight and vitals stable. Denies pain. Reports bowels are loose. Denies diarrhea. Reports he continues to take compazine prior to xrt. Denies episodes of nausea or vomiting. Reports abdominal cramping earlier in the week resolved with Gas X. Denies dysuria, hematuria, frequency or urgency. Reports nocturia x 1. Reports fatigue.   Set-up films were reviewed. The chart was checked.  Physical Findings  weight is 212 lb 3.2 oz (96.3 kg). His blood pressure is 123/77 and his pulse is 89. His respiration is 18 and oxygen saturation is 100%. . Weight essentially stable.  No significant changes.  Impression The patient is tolerating radiation.  Plan Continue treatment as planned. The patient will follow up in 1 month following completion of radiation treatment.       Sheral Apley [name], M.D.  This document serves as a record of services personally performed by Tyler Pita, MD. It was created on his behalf by Maryla Morrow, a trained medical scribe. The creation of this record is based on the scribe's personal observations and the provider's statements to them. This document has been checked and approved by the attending provider.

## 2016-08-26 NOTE — Progress Notes (Signed)
Weight and vitals stable. Denies pain. Reports bowels are loose. Denies diarrhea. Reports he continues to take compazine prior to xrt. Denies episodes of nausea or vomiting. Reports abdominal cramping earlier in the week resolved with Gas X. Denies dysuria, hematuria, frequency or urgency. Reports nocturia x 1. Reports fatigue.   BP 123/77   Pulse 89   Resp 18   Wt 212 lb 3.2 oz (96.3 kg)   SpO2 100%   BMI 30.45 kg/m  Wt Readings from Last 3 Encounters:  08/26/16 212 lb 3.2 oz (96.3 kg)  08/19/16 210 lb 9.6 oz (95.5 kg)  08/11/16 207 lb (93.9 kg)

## 2016-08-26 NOTE — Progress Notes (Signed)
One month follow up appointment card provided to the patient. 

## 2016-08-30 ENCOUNTER — Ambulatory Visit
Admission: RE | Admit: 2016-08-30 | Discharge: 2016-08-30 | Disposition: A | Discharge status: Home / Self Care | Payer: 59 | Source: Ambulatory Visit | Attending: Radiation Oncology | Admitting: Radiation Oncology

## 2016-08-30 ENCOUNTER — Encounter: Payer: Self-pay | Admitting: Radiation Oncology

## 2016-08-30 DIAGNOSIS — Z510 Encounter for antineoplastic radiation therapy: Secondary | ICD-10-CM | POA: Diagnosis not present

## 2016-09-04 NOTE — Progress Notes (Signed)
  Radiation Oncology         [phone] ________________________________  Name: Connor Howell MRN: [id number]  Date: 08/30/2016  DOB: 03/03/1966  End of Treatment Note  Diagnosis:   50 yo man with stage IIB seminoma     Indication for treatment:  Curative       Radiation treatment dates:   08/04/16-08/30/16  Site/dose:    1.  The paraaortic and ipsiliateral pelvic nodes were treated to 20 Gy in 10 fractions 2.  The involved enlarged lymph node was treated to 36 Gy with 8 additional fractions  Beams/energy:    1.  The paraaortic and ipsiliateral pelvic nodes were treated using AP/PA 15 MV X-rays 2.  The involved enlarged lymph node was treated using AP/PA 15 MV X-rays  Narrative: The patient tolerated radiation treatment relatively well.    Denies pain. Reports bowels are loose. Denies diarrhea. Reports he continues to take compazine prior to xrt. Denies episodes of nausea or vomiting. Reports abdominal cramping earlier in the week resolved with Gas X. Denies dysuria, hematuria, frequency or urgency. Reports nocturia x 1. Reports fatigue  Plan: The patient has completed radiation treatment. The patient will return to radiation oncology clinic for routine followup in one month. I advised him to call or return sooner if he has any questions or concerns related to his recovery or treatment. ________________________________  Sheral Apley. [name], M.D.

## 2016-09-15 ENCOUNTER — Ambulatory Visit (HOSPITAL_BASED_OUTPATIENT_CLINIC_OR_DEPARTMENT_OTHER): Payer: 59 | Admitting: Hematology & Oncology

## 2016-09-15 ENCOUNTER — Other Ambulatory Visit (HOSPITAL_BASED_OUTPATIENT_CLINIC_OR_DEPARTMENT_OTHER): Payer: 59

## 2016-09-15 VITALS — BP 131/74 | HR 76 | Temp 98.1°F | Resp 16 | Wt 215.0 lb

## 2016-09-15 DIAGNOSIS — C772 Secondary and unspecified malignant neoplasm of intra-abdominal lymph nodes: Secondary | ICD-10-CM

## 2016-09-15 DIAGNOSIS — C6292 Malignant neoplasm of left testis, unspecified whether descended or undescended: Secondary | ICD-10-CM

## 2016-09-15 DIAGNOSIS — I82401 Acute embolism and thrombosis of unspecified deep veins of right lower extremity: Secondary | ICD-10-CM

## 2016-09-15 LAB — CMP (CANCER CENTER ONLY)
ALK PHOS: 64 U/L (ref 26–84)
ALT: 51 U/L — AB (ref 10–47)
AST: 41 U/L — AB (ref 11–38)
Albumin: 3.8 g/dL (ref 3.3–5.5)
BILIRUBIN TOTAL: 0.7 mg/dL (ref 0.20–1.60)
BUN, Bld: 19 mg/dL (ref 7–22)
CO2: 27 mEq/L (ref 18–33)
Calcium: 9.1 mg/dL (ref 8.0–10.3)
Chloride: 101 mEq/L (ref 98–108)
Creat: 1 mg/dl (ref 0.6–1.2)
GLUCOSE: 89 mg/dL (ref 73–118)
Potassium: 3.8 mEq/L (ref 3.3–4.7)
Sodium: 139 mEq/L (ref 128–145)
TOTAL PROTEIN: 7.5 g/dL (ref 6.4–8.1)

## 2016-09-15 LAB — CBC WITH DIFFERENTIAL (CANCER CENTER ONLY)
BASO#: 0 10*3/uL (ref 0.0–0.2)
BASO%: 0.5 % (ref 0.0–2.0)
EOS%: 4.1 % (ref 0.0–7.0)
Eosinophils Absolute: 0.2 10*3/uL (ref 0.0–0.5)
HCT: 41 % (ref 38.7–49.9)
HEMOGLOBIN: 14 g/dL (ref 13.0–17.1)
LYMPH#: 0.9 10*3/uL (ref 0.9–3.3)
LYMPH%: 22.4 % (ref 14.0–48.0)
MCH: 30 pg (ref 28.0–33.4)
MCHC: 34.1 g/dL (ref 32.0–35.9)
MCV: 88 fL (ref 82–98)
MONO#: 0.5 10*3/uL (ref 0.1–0.9)
MONO%: 12.9 % (ref 0.0–13.0)
NEUT%: 60.1 % (ref 40.0–80.0)
NEUTROS ABS: 2.5 10*3/uL (ref 1.5–6.5)
Platelets: 181 10*3/uL (ref 145–400)
RBC: 4.67 10*6/uL (ref 4.20–5.70)
RDW: 13.2 % (ref 11.1–15.7)
WBC: 4.1 10*3/uL (ref 4.0–10.0)

## 2016-09-15 LAB — PROTIME-INR (CHCC SATELLITE)
INR: 2 (ref 2.0–3.5)
PROTIME: 24 s — AB (ref 10.6–13.4)

## 2016-09-15 NOTE — Progress Notes (Signed)
Hematology and Oncology Follow Up Visit  Connor Howell [id number] [birth year] 51 y.o. 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     Interim History:  [name] is back for follow-up. He did undergo radiation therapy. He completed radiation therapy on December 26. He received 20  Gy in 10 fractions to the para-aortic and ipsilateral pelvic nodes. He received a total of 36 Gy to the enlarged pelvic lymph node. He got through treatment well. He had a little bit of nausea. He had a little bit of diarrhea. His skin was a little bit irritated.  He is still working. He's having no problems at work.  We last saw him, the only elevation that he had was the LDH. Back in November his LDH was 262 which was mildly elevated.  Overall, his performance status right now is ECOG 0.    Medications:  Current Outpatient Prescriptions:  Marland Kitchen  Multiple Vitamin (MULTIVITAMIN) tablet, Take 1 tablet by mouth daily., Disp: , Rfl:  .  warfarin (COUMADIN) 5 MG tablet, TAKE 1 TABLET BY MOUTH EVERY DAY (Patient taking differently: TAKE 1/2 TABLET BY MOUTH EVERY DAY), Disp: 30 tablet, Rfl: 3  Allergies:  Allergies  Allergen Reactions  . Epinephrine Other (See Comments)    Past Medical History, Surgical history, Social history, and Family History were reviewed and updated.  Review of Systems: As above  Physical Exam:  weight is 215 lb (97.5 kg). His temperature is 98.1 F (36.7 C). His blood pressure is 131/74 and his pulse is 76. His respiration is 16 and oxygen saturation is 100%.   Wt Readings from Last 3 Encounters:  09/15/16 215 lb (97.5 kg)  08/26/16 212 lb 3.2 oz (96.3 kg)  08/19/16 210 lb 9.6 oz (95.5 kg)      Well-developed and well-nourished white male in no obvious distress. Head and neck exam shows  no ocular or oral lesions. There are no palpable cervical or supraclavicular lymph nodes. Lungs are clear. Cardiac exam regular rate and rhythm with no murmurs, rubs or bruits. Abdomen is soft. He has good bowel sounds. There is no fluid wave. He has a well-healed left orchiectomy scar. He has no guarding or rebound tenderness. He has no palpable liver or spleen tip. Back exam shows no tenderness over the spine, ribs or hips. Extremities shows some chronic nonpitting edema of the right lower leg. No venous cord is noted. He has a negative Homans sign. Skin exam shows no rashes, ecchymoses or petechia. Neurological exam is nonfocal.  Lab Results  Component Value Date   WBC 4.1 09/15/2016   HGB 14.0 09/15/2016   HCT 41.0 09/15/2016   MCV 88 09/15/2016   PLT 181 09/15/2016     Chemistry      Component Value Date/Time   NA 139 09/15/2016 1441   NA 139 07/14/2016 0910   K 3.8 09/15/2016 1441   K 4.5 07/14/2016 0910   CL 101 09/15/2016 1441   CO2 27 09/15/2016 1441   CO2 27 07/14/2016 0910   BUN 19 09/15/2016 1441   BUN 12.9 07/14/2016 0910   CREATININE 1.0 09/15/2016 1441   CREATININE 0.9 07/14/2016 0910      Component Value Date/Time   CALCIUM 9.1 09/15/2016 1441   CALCIUM 9.6 07/14/2016 0910  ALKPHOS 64 09/15/2016 1441   ALKPHOS 83 07/14/2016 0910   AST 41 (H) 09/15/2016 1441   AST 31 07/14/2016 0910   ALT 51 (H) 09/15/2016 1441   ALT 37 07/14/2016 0910   BILITOT 0.70 09/15/2016 1441   BILITOT 0.61 07/14/2016 0910     LDH is 262    Impression and Plan: [name] is a 51 year old white male. He obviously now has stage II seminoma of the left testicle. He did receive adjuvant radiation therapy. He tolerated this well. Hopefully, and I actually believe, that this will be effective.  We will go ahead and repeat a CT scan in about 2 months. I think this be very reasonable.  I would do follow-up scans every 4 months for the first year. After that, I would then do scans  every 6 months for 2 additional years.  We can follow his tumor markers although I suspect that these will be normal.  I think the chance of him having recurrence probably is going to be less than 10%.   He will continue on the Coumadin. He is still well on Coumadin. His INR today was 2.0.    [name], MD 1/11/20185:42 PM

## 2016-09-16 LAB — AFP TUMOR MARKER: AFP, Serum, Tumor Marker: 3.9 ng/mL (ref 0.0–8.3)

## 2016-09-16 LAB — BETA HCG QUANT (REF LAB): hCG Quant: <1 m[IU]/mL (ref 0–3)

## 2016-09-16 LAB — LACTATE DEHYDROGENASE: LDH: 230 U/L (ref 125–245)

## 2016-10-04 DIAGNOSIS — E785 Hyperlipidemia, unspecified: Secondary | ICD-10-CM | POA: Diagnosis not present

## 2016-10-04 DIAGNOSIS — L309 Dermatitis, unspecified: Secondary | ICD-10-CM | POA: Diagnosis not present

## 2016-10-04 DIAGNOSIS — C6212 Malignant neoplasm of descended left testis: Secondary | ICD-10-CM | POA: Diagnosis not present

## 2016-10-04 DIAGNOSIS — I83019 Varicose veins of right lower extremity with ulcer of unspecified site: Secondary | ICD-10-CM | POA: Diagnosis not present

## 2016-10-11 DIAGNOSIS — I83019 Varicose veins of right lower extremity with ulcer of unspecified site: Secondary | ICD-10-CM | POA: Diagnosis not present

## 2016-10-11 DIAGNOSIS — E785 Hyperlipidemia, unspecified: Secondary | ICD-10-CM | POA: Diagnosis not present

## 2016-10-11 DIAGNOSIS — L309 Dermatitis, unspecified: Secondary | ICD-10-CM | POA: Diagnosis not present

## 2016-10-19 NOTE — Progress Notes (Signed)
Connor Howell 51 y.o. man with Stage IIB seminoma radiation completed 08-30-16 one month FU.   Pain:He denies having pain. Appetite:Good eating three meals per day and eats snack most days. Bladder issues: Denies  urinary frequency,urinary urgency,having incontinence,hematuria or dysuria. Pt states he gets up to urinate x1 times per night. Nausea/Vomiting:No Wt Readings from Last 3 Encounters:  10/25/16 209 lb 9.6 oz (95.1 kg)  09/15/16 215 lb (97.5 kg)  08/26/16 212 lb 3.2 oz (96.3 kg)  BP (!) 142/78   Pulse 63   Temp 97.8 F (36.6 C) (Oral)   Resp 18   Ht 5\' 10"  (1.778 m)   Wt 209 lb 9.6 oz (95.1 kg)   SpO2 100%   BMI 30.07 kg/m

## 2016-10-25 ENCOUNTER — Ambulatory Visit
Admission: RE | Admit: 2016-10-25 | Discharge: 2016-10-25 | Disposition: A | Discharge status: Home / Self Care | Payer: 59 | Source: Ambulatory Visit | Attending: Radiation Oncology | Admitting: Radiation Oncology

## 2016-10-25 ENCOUNTER — Telehealth: Payer: Self-pay | Admitting: *Deleted

## 2016-10-25 ENCOUNTER — Encounter: Payer: Self-pay | Admitting: Radiation Oncology

## 2016-10-25 VITALS — BP 142/78 | HR 63 | Temp 97.8°F | Resp 18 | Ht 70.0 in | Wt 209.6 lb

## 2016-10-25 DIAGNOSIS — C6292 Malignant neoplasm of left testis, unspecified whether descended or undescended: Secondary | ICD-10-CM | POA: Insufficient documentation

## 2016-10-25 DIAGNOSIS — Z08 Encounter for follow-up examination after completed treatment for malignant neoplasm: Secondary | ICD-10-CM | POA: Insufficient documentation

## 2016-10-25 DIAGNOSIS — Z7901 Long term (current) use of anticoagulants: Secondary | ICD-10-CM | POA: Diagnosis not present

## 2016-10-25 NOTE — Progress Notes (Signed)
Radiation Oncology         [phone] ________________________________  Name: Connor Howell MRN: [id number]  Date: 10/25/2016  DOB: 08/20/1966  Post Treatment Note  CC: Melinda Crutch, MD  [name], MD  Diagnosis:   51 y.o. 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  The paraaortic and ipsiliateral pelvic nodes were treated to 20 Gy in 10 fractions 2.  The involved enlarged lymph node was treated to 36 Gy with 8 additional fractions  Narrative:  The patient returns today for routine follow-up.  The patient tolerated radiotherapy to the ipsilateral pelvic nodes, and periaortic nodes on the left. There's been seen by Dr.) has plans to undergo repeat imaging on 11/10/2016. The patient reports that he did have some evidence of nausea toward the end of his treatment.                           On review of systems, the patient states he is doing pretty well overall. He did have some episodes of nausea towards the end of this treatment. He reports that symptoms initially resolved but in the last 2 weeks he had a few episodes began of an upset stomach. He continues on Coumadin, denies any evidence of hemoptysis, or hematemesis. He denies any dark tarry stools. He is not experiencing abdominal pain per se, and denies any sudden or acute onset of his symptoms. No other complaints or verbalized.   ALLERGIES:  is allergic to epinephrine.  Meds: Current Outpatient Prescriptions  Medication Sig Dispense Refill  . Multiple Vitamin (MULTIVITAMIN) tablet Take 1 tablet by mouth daily.    Marland Kitchen warfarin (COUMADIN) 5 MG tablet TAKE 1 TABLET BY MOUTH EVERY DAY (Patient taking differently: TAKE 1/2 TABLET BY MOUTH EVERY DAY) 30 tablet 3   No current facility-administered medications for this encounter.     Physical Findings:  height is 5\' 10"  (1.778 m) and weight is 209 lb 9.6 oz (95.1 kg). His oral temperature is 97.8  F (36.6 C). His blood pressure is 142/78 (abnormal) and his pulse is 63. His respiration is 18 and oxygen saturation is 100%.  Pain Assessment Pain Score: 0-No pain/10 In general this is a well appearing caucasian male in no acute distress. He's alert and oriented x4 and appropriate throughout the examination. Cardiopulmonary assessment is negative for acute distress and he exhibits normal effort.   Lab Findings: Lab Results  Component Value Date   WBC 4.1 09/15/2016   HGB 14.0 09/15/2016   HCT 41.0 09/15/2016   MCV 88 09/15/2016   PLT 181 09/15/2016     Radiographic Findings: No results found.  Impression/Plan: 1. 51 y.o. 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 The patient appears to be doing well since completion of the radiotherapy. We anticipate some residual symptoms are continuing to improve. I did ask him that there may be an indication for him to consider Prilosec over-the-counter, particularly given his recent radiation but more so considering his Coumadin use. Also discussed the use of Tums over-the-counter. He will keep [place] informed any questions or concerns that we can help in triage if his symptoms progress. We did discuss that if he did have persistent symptoms despite these interventions, that GI evaluation may be indicated. Regarding his cancer however, Dr. Johnathan Hausen is already scheduled his posttreatment imaging study for march. We will plan  to contact him following this he had a chance to review the images as well. We agree with repeat imaging, and CCM guidelines recommend scans the first year every 3-6 months, and we will plan to see him back as well in 6 months time. The patient understands that if he is doing well after a year of this type of surveillance we may cut back to annual evaluation. He is in agreement and is happy to have multidisciplinary approach to his follow-up.       Carola Rhine, PAC

## 2016-10-25 NOTE — Telephone Encounter (Signed)
CALLED PATIENT TO INFORM OF FU WITH ALISON PERKINS ON 04/25/17 @ 10:30 AM, LVM FOR A RETURN CALL

## 2016-10-25 NOTE — Addendum Note (Signed)
Encounter addended by: Malena Edman, RN on: 10/25/2016 11:32 AM<BR>    Actions taken: Charge Capture section accepted

## 2016-11-10 ENCOUNTER — Ambulatory Visit (HOSPITAL_BASED_OUTPATIENT_CLINIC_OR_DEPARTMENT_OTHER)
Admission: RE | Admit: 2016-11-10 | Discharge: 2016-11-10 | Disposition: A | Discharge status: Home / Self Care | Payer: 59 | Source: Ambulatory Visit | Attending: Hematology & Oncology | Admitting: Hematology & Oncology

## 2016-11-10 ENCOUNTER — Other Ambulatory Visit (HOSPITAL_BASED_OUTPATIENT_CLINIC_OR_DEPARTMENT_OTHER): Payer: 59

## 2016-11-10 ENCOUNTER — Ambulatory Visit (HOSPITAL_BASED_OUTPATIENT_CLINIC_OR_DEPARTMENT_OTHER): Payer: 59 | Admitting: Hematology & Oncology

## 2016-11-10 VITALS — BP 131/70 | HR 59 | Resp 18 | Wt 206.2 lb

## 2016-11-10 DIAGNOSIS — K5730 Diverticulosis of large intestine without perforation or abscess without bleeding: Secondary | ICD-10-CM | POA: Diagnosis not present

## 2016-11-10 DIAGNOSIS — R590 Localized enlarged lymph nodes: Secondary | ICD-10-CM | POA: Diagnosis not present

## 2016-11-10 DIAGNOSIS — C772 Secondary and unspecified malignant neoplasm of intra-abdominal lymph nodes: Secondary | ICD-10-CM

## 2016-11-10 DIAGNOSIS — C6292 Malignant neoplasm of left testis, unspecified whether descended or undescended: Secondary | ICD-10-CM

## 2016-11-10 DIAGNOSIS — I749 Embolism and thrombosis of unspecified artery: Secondary | ICD-10-CM | POA: Diagnosis not present

## 2016-11-10 LAB — CMP (CANCER CENTER ONLY)
ALK PHOS: 60 U/L (ref 26–84)
ALT: 33 U/L (ref 10–47)
AST: 30 U/L (ref 11–38)
Albumin: 4 g/dL (ref 3.3–5.5)
BUN: 12 mg/dL (ref 7–22)
CO2: 30 mEq/L (ref 18–33)
CREATININE: 1 mg/dL (ref 0.6–1.2)
Calcium: 8.9 mg/dL (ref 8.0–10.3)
Chloride: 102 mEq/L (ref 98–108)
Glucose, Bld: 93 mg/dL (ref 73–118)
Potassium: 4.1 mEq/L (ref 3.3–4.7)
Sodium: 142 mEq/L (ref 128–145)
TOTAL PROTEIN: 7.3 g/dL (ref 6.4–8.1)
Total Bilirubin: 0.7 mg/dl (ref 0.20–1.60)

## 2016-11-10 LAB — CBC WITH DIFFERENTIAL (CANCER CENTER ONLY)
BASO#: 0 10*3/uL (ref 0.0–0.2)
BASO%: 0.5 % (ref 0.0–2.0)
EOS%: 2 % (ref 0.0–7.0)
Eosinophils Absolute: 0.1 10*3/uL (ref 0.0–0.5)
HCT: 40.9 % (ref 38.7–49.9)
HGB: 13.9 g/dL (ref 13.0–17.1)
LYMPH#: 0.7 10*3/uL — ABNORMAL LOW (ref 0.9–3.3)
LYMPH%: 16.6 % (ref 14.0–48.0)
MCH: 30.3 pg (ref 28.0–33.4)
MCHC: 34 g/dL (ref 32.0–35.9)
MCV: 89 fL (ref 82–98)
MONO#: 0.4 10*3/uL (ref 0.1–0.9)
MONO%: 10 % (ref 0.0–13.0)
NEUT#: 2.9 10*3/uL (ref 1.5–6.5)
NEUT%: 70.9 % (ref 40.0–80.0)
PLATELETS: 159 10*3/uL (ref 145–400)
RBC: 4.58 10*6/uL (ref 4.20–5.70)
RDW: 13 % (ref 11.1–15.7)
WBC: 4.1 10*3/uL (ref 4.0–10.0)

## 2016-11-10 LAB — LACTATE DEHYDROGENASE: LDH: 220 U/L (ref 125–245)

## 2016-11-10 MED ORDER — IOPAMIDOL (ISOVUE-300) INJECTION 61%
100.0000 mL | Freq: Once | INTRAVENOUS | Status: AC | PRN
  Administered 2016-11-10: 100 mL via INTRAVENOUS

## 2016-11-10 NOTE — Progress Notes (Signed)
Hematology and Oncology Follow Up Visit  Connor Howell [id number] [birth year] 51 y.o. 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     Interim History:  [name] is back for follow-up. He did undergo radiation therapy. He completed radiation therapy on December 26. He received 20  Gy in 10 fractions to the para-aortic and ipsilateral pelvic nodes. He received a total of 36 Gy to the enlarged pelvic lymph node. He got through treatment well. He had a little bit of nausea. He had a little bit of diarrhea. His skin was a little bit irritated.  We did go ahead and get a CT scan on him today. The CT scan did not show any evidence of recurrent disease.  With the last saw him, his tumor markers were all normal. His alpha-fetoprotein was 3.9. His beta hCG was less than 1. His LDH was 230.  He's had no problem with abdominal pain. He's had no problems with bowels or bladder. There's been no issues with fever. He's had no cough. He's had no problems with rashes.  He does have the blood clot in the right leg. He does wear compression stocking. This is not been an issue for him. He has been on Coumadin. Coumadin has been very effective for him. His last INR was 2.0.  He is still working. He's having no problems at work. There's no fatigue or weakness.  He is looking for to the summertime. Hopefully, he and his family will go on vacation.  Overall, his performance status right now is ECOG 0.    Medications:  Current Outpatient Prescriptions:  Marland Kitchen  Multiple Vitamin (MULTIVITAMIN) tablet, Take 1 tablet by mouth daily., Disp: , Rfl:  .  warfarin (COUMADIN) 5 MG tablet, TAKE 1 TABLET BY MOUTH EVERY DAY (Patient taking differently: TAKE 1/2 TABLET BY MOUTH EVERY DAY), Disp: 30 tablet, Rfl: 3  Allergies:    Allergies  Allergen Reactions  . Epinephrine Other (See Comments)    Past Medical History, Surgical history, Social history, and Family History were reviewed and updated.  Review of Systems: As above  Physical Exam:  weight is 206 lb 4 oz (93.6 kg). His blood pressure is 131/70 and his pulse is 59 (abnormal). His respiration is 18 and oxygen saturation is 100%.   Wt Readings from Last 3 Encounters:  11/10/16 206 lb 4 oz (93.6 kg)  10/25/16 209 lb 9.6 oz (95.1 kg)  09/15/16 215 lb (97.5 kg)      Well-developed and well-nourished white male in no obvious distress. Head and neck exam shows no ocular or oral lesions. There are no palpable cervical or supraclavicular lymph nodes. Lungs are clear. Cardiac exam regular rate and rhythm with no murmurs, rubs or bruits. Abdomen is soft. He has good bowel sounds. There is no fluid wave. He has a well-healed left orchiectomy scar. He has no guarding or rebound tenderness. He has no palpable liver or spleen tip. Back exam shows no tenderness over the spine, ribs or hips. Extremities shows some chronic nonpitting edema of the right lower leg. No venous cord is noted. He has a negative Homans sign. Skin exam shows no rashes, ecchymoses or petechia. Neurological exam is nonfocal.  Lab Results  Component Value Date   WBC 4.1 11/10/2016  HGB 13.9 11/10/2016   HCT 40.9 11/10/2016   MCV 89 11/10/2016   PLT 159 11/10/2016     Chemistry      Component Value Date/Time   NA 142 11/10/2016 0951   NA 139 07/14/2016 0910   K 4.1 11/10/2016 0951   K 4.5 07/14/2016 0910   CL 102 11/10/2016 0951   CO2 30 11/10/2016 0951   CO2 27 07/14/2016 0910   BUN 12 11/10/2016 0951   BUN 12.9 07/14/2016 0910   CREATININE 1.0 11/10/2016 0951   CREATININE 0.9 07/14/2016 0910      Component Value Date/Time   CALCIUM 8.9 11/10/2016 0951   CALCIUM 9.6 07/14/2016 0910   ALKPHOS 60 11/10/2016 0951   ALKPHOS 83 07/14/2016 0910   AST 30 11/10/2016 0951   AST 31  07/14/2016 0910   ALT 33 11/10/2016 0951   ALT 37 07/14/2016 0910   BILITOT 0.70 11/10/2016 0951   BILITOT 0.61 07/14/2016 0910     LDH is 262    Impression and Plan: [name] is a 51 year old white male. He obviously now has stage II seminoma of the left testicle. He did receive adjuvant radiation therapy. He tolerated this well.   Thankfully, there is no evidence of recurrent disease. I have to believe that the radiation that he took will cure him.  We will see him back in 4 months. We will get a CT scan when we see him back.  He is still on Coumadin. He is doing well on Coumadin. We've not had a changes Coumadin dose for over a year.  [name], MD 3/8/201811:16 AM

## 2016-11-11 ENCOUNTER — Telehealth: Payer: Self-pay | Admitting: *Deleted

## 2016-11-11 LAB — AFP TUMOR MARKER: AFP, Serum, Tumor Marker: 4.2 ng/mL (ref 0.0–8.3)

## 2016-11-11 LAB — BETA HCG QUANT (REF LAB): hCG Quant: <1 m[IU]/mL (ref 0–3)

## 2016-11-11 NOTE — Telephone Encounter (Addendum)
Patient aware of results  ----- Message from [name], MD sent at 11/11/2016  6:41 AM EST ----- Call - all of the tumor markers are normal!!!  Yeah!!!  pete

## 2016-11-17 ENCOUNTER — Other Ambulatory Visit: Payer: 59

## 2016-11-17 ENCOUNTER — Ambulatory Visit: Payer: 59 | Admitting: Hematology & Oncology

## 2017-03-05 ENCOUNTER — Other Ambulatory Visit: Payer: Self-pay | Admitting: Hematology & Oncology

## 2017-03-14 ENCOUNTER — Telehealth: Payer: Self-pay | Admitting: Radiation Oncology

## 2017-03-14 DIAGNOSIS — Z0000 Encounter for general adult medical examination without abnormal findings: Secondary | ICD-10-CM | POA: Diagnosis not present

## 2017-03-14 DIAGNOSIS — E785 Hyperlipidemia, unspecified: Secondary | ICD-10-CM | POA: Diagnosis not present

## 2017-03-14 NOTE — Telephone Encounter (Signed)
If you let him know that I'll follow up with the scan as well and D/W Dr. [name] then get back with him about his thoughts. We will continue to follow the patient, alternating with Dr. Marin Olp.

## 2017-03-14 NOTE — Telephone Encounter (Signed)
Returned patient's call. Patient explains he is scheduled for a CT scan and follow up with Dr. Marin Olp on 7/12. Then, a follow up with Shona Simpson in August. Patient wanted to confirm the date and time of the follow up with Bryson Ha. Also, patient questions if Dr. Johny Shears review of his scans aligns with Dr. Antonieta Pert view since "Dr. [name] did the radiation." Patient questions if more frequent follow ups are needed with Dr. [name] or will Dr. Marin Olp be the one to follow him more closely. Explained this RN will inform Shona Simpson, PA-C and Dr. [name] of these findings then, phone him back with an explanation. Patient expressed appreciation and understanding.

## 2017-03-14 NOTE — Telephone Encounter (Signed)
I will be glad to. He said there is no rush. Connor Howell

## 2017-03-16 ENCOUNTER — Telehealth: Payer: Self-pay | Admitting: Radiation Oncology

## 2017-03-16 ENCOUNTER — Other Ambulatory Visit (HOSPITAL_BASED_OUTPATIENT_CLINIC_OR_DEPARTMENT_OTHER): Payer: 59

## 2017-03-16 ENCOUNTER — Ambulatory Visit (HOSPITAL_BASED_OUTPATIENT_CLINIC_OR_DEPARTMENT_OTHER): Payer: 59 | Admitting: Family

## 2017-03-16 ENCOUNTER — Ambulatory Visit (HOSPITAL_BASED_OUTPATIENT_CLINIC_OR_DEPARTMENT_OTHER)
Admission: RE | Admit: 2017-03-16 | Discharge: 2017-03-16 | Disposition: A | Discharge status: Home / Self Care | Payer: 59 | Source: Ambulatory Visit | Attending: Hematology & Oncology | Admitting: Hematology & Oncology

## 2017-03-16 VITALS — BP 129/68 | HR 62 | Temp 98.3°F | Resp 16 | Wt 193.0 lb

## 2017-03-16 DIAGNOSIS — I82401 Acute embolism and thrombosis of unspecified deep veins of right lower extremity: Secondary | ICD-10-CM | POA: Diagnosis not present

## 2017-03-16 DIAGNOSIS — C6292 Malignant neoplasm of left testis, unspecified whether descended or undescended: Secondary | ICD-10-CM

## 2017-03-16 DIAGNOSIS — M5137 Other intervertebral disc degeneration, lumbosacral region: Secondary | ICD-10-CM | POA: Insufficient documentation

## 2017-03-16 DIAGNOSIS — M4607 Spinal enthesopathy, lumbosacral region: Secondary | ICD-10-CM | POA: Insufficient documentation

## 2017-03-16 DIAGNOSIS — C6290 Malignant neoplasm of unspecified testis, unspecified whether descended or undescended: Secondary | ICD-10-CM | POA: Diagnosis not present

## 2017-03-16 DIAGNOSIS — K5730 Diverticulosis of large intestine without perforation or abscess without bleeding: Secondary | ICD-10-CM | POA: Insufficient documentation

## 2017-03-16 DIAGNOSIS — D6859 Other primary thrombophilia: Secondary | ICD-10-CM | POA: Diagnosis not present

## 2017-03-16 LAB — CBC WITH DIFFERENTIAL (CANCER CENTER ONLY)
BASO#: 0 10*3/uL (ref 0.0–0.2)
BASO%: 0.3 % (ref 0.0–2.0)
EOS%: 2.4 % (ref 0.0–7.0)
Eosinophils Absolute: 0.1 10*3/uL (ref 0.0–0.5)
HCT: 40.7 % (ref 38.7–49.9)
HGB: 13.9 g/dL (ref 13.0–17.1)
LYMPH#: 0.8 10*3/uL — ABNORMAL LOW (ref 0.9–3.3)
LYMPH%: 22.7 % (ref 14.0–48.0)
MCH: 30.8 pg (ref 28.0–33.4)
MCHC: 34.2 g/dL (ref 32.0–35.9)
MCV: 90 fL (ref 82–98)
MONO#: 0.3 10*3/uL (ref 0.1–0.9)
MONO%: 9.6 % (ref 0.0–13.0)
NEUT#: 2.2 10*3/uL (ref 1.5–6.5)
NEUT%: 65 % (ref 40.0–80.0)
PLATELETS: 147 10*3/uL (ref 145–400)
RBC: 4.51 10*6/uL (ref 4.20–5.70)
RDW: 12.7 % (ref 11.1–15.7)
WBC: 3.4 10*3/uL — AB (ref 4.0–10.0)

## 2017-03-16 LAB — CMP (CANCER CENTER ONLY)
ALT(SGPT): 37 U/L (ref 10–47)
AST: 32 U/L (ref 11–38)
Albumin: 3.8 g/dL (ref 3.3–5.5)
Alkaline Phosphatase: 66 U/L (ref 26–84)
BUN: 13 mg/dL (ref 7–22)
CHLORIDE: 103 meq/L (ref 98–108)
CO2: 29 meq/L (ref 18–33)
CREATININE: 1.1 mg/dL (ref 0.6–1.2)
Calcium: 8.7 mg/dL (ref 8.0–10.3)
GLUCOSE: 93 mg/dL (ref 73–118)
Potassium: 4.3 mEq/L (ref 3.3–4.7)
SODIUM: 140 meq/L (ref 128–145)
Total Bilirubin: 0.7 mg/dl (ref 0.20–1.60)
Total Protein: 7.3 g/dL (ref 6.4–8.1)

## 2017-03-16 LAB — LACTATE DEHYDROGENASE: LDH: 205 U/L (ref 125–245)

## 2017-03-16 LAB — PROTIME-INR (CHCC SATELLITE)
INR: 1.8 — ABNORMAL LOW (ref 2.0–3.5)
PROTIME: 21.6 s — AB (ref 10.6–13.4)

## 2017-03-16 MED ORDER — IOPAMIDOL (ISOVUE-300) INJECTION 61%
100.0000 mL | Freq: Once | INTRAVENOUS | Status: AC | PRN
  Administered 2017-03-16: 100 mL via INTRAVENOUS

## 2017-03-16 NOTE — Progress Notes (Signed)
Hematology and Oncology Follow Up Visit  Connor Howell [id number] [birth year] 51 y.o. 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 is here today for follow-up and lab. He is doing well and has no complaints at this time. He had CT scans today which showed no evidence of active or recurrent malignancy. In March, his AFP tumor marker was 4.2, Beta HCG was < 1 and LDH 220.  No lymphadenopathy found on exam. No c/o fatigue.  He is doing well on Coumadin and verbalized that he is taking 2.5 mg PO daily. INR is 1.8 so we will have him continue on this same dose.  He denies having had any episodes of bleeding, bruising or petechiae.  No fever, chills, n/v, cough, rash, dizziness, SOB, chest pain, palpitations, abdominal pain or changes in bowel or bladder habits.  No swelling, tenderness, numbness or tingling in her extremities. No c/o pain at this time. He wears his compression stockings daily for support.  He has maintained a good appetite and is staying well hydrated. His weight is stable.   ECOG Performance Status: 0 - Asymptomatic  Medications:  Allergies as of 03/16/2017      Reactions   Epinephrine Other (See Comments)      Medication List       Accurate as of 03/16/17  6:38 PM. Always use your most recent med list.          multivitamin tablet Take 1 tablet by mouth daily.   warfarin 5 MG tablet Commonly known as:  COUMADIN TAKE 1 TABLET BY MOUTH EVERY DAY       Allergies:  Allergies  Allergen Reactions  . Epinephrine Other (See Comments)    Past Medical History, Surgical history, Social history, and Family History were reviewed and updated.  Review of Systems: All other 10 point review of systems is negative.   Physical Exam:  weight is 193 lb (87.5  kg). His oral temperature is 98.3 F (36.8 C). His blood pressure is 129/68 and his pulse is 62. His respiration is 16 and oxygen saturation is 100%.   Wt Readings from Last 3 Encounters:  03/16/17 193 lb (87.5 kg)  11/10/16 206 lb 4 oz (93.6 kg)  10/25/16 209 lb 9.6 oz (95.1 kg)    Ocular: Sclerae unicteric, pupils equal, round and reactive to light Ear-nose-throat: Oropharynx clear, dentition fair Lymphatic: No cervical, supraclavicular or axillary adenopathy Lungs no rales or rhonchi, good excursion bilaterally Heart regular rate and rhythm, no murmur appreciated Abd soft, nontender, positive bowel sounds, no liver or spleen tip palpated on exam, no fluid wave MSK no focal spinal tenderness, no joint edema Neuro: non-focal, well-oriented, appropriate affect Breasts: Deferred   Lab Results  Component Value Date   WBC 3.4 (L) 03/16/2017   HGB 13.9 03/16/2017   HCT 40.7 03/16/2017   MCV 90 03/16/2017   PLT 147 03/16/2017   No results found for: FERRITIN, IRON, TIBC, UIBC, IRONPCTSAT Lab Results  Component Value Date   RBC 4.51 03/16/2017   No results found for: KPAFRELGTCHN, LAMBDASER, KAPLAMBRATIO No results found for: IGGSERUM, IGA, IGMSERUM No results found for: TOTALPROTELP, ALBUMINELP, A1GS, A2GS, BETS, BETA2SER, GAMS, MSPIKE, SPEI   Chemistry      Component Value Date/Time   NA 140 03/16/2017  0839   NA 139 07/14/2016 0910   K 4.3 03/16/2017 0839   K 4.5 07/14/2016 0910   CL 103 03/16/2017 0839   CO2 29 03/16/2017 0839   CO2 27 07/14/2016 0910   BUN 13 03/16/2017 0839   BUN 12.9 07/14/2016 0910   CREATININE 1.1 03/16/2017 0839   CREATININE 0.9 07/14/2016 0910      Component Value Date/Time   CALCIUM 8.7 03/16/2017 0839   CALCIUM 9.6 07/14/2016 0910   ALKPHOS 66 03/16/2017 0839   ALKPHOS 83 07/14/2016 0910   AST 32 03/16/2017 0839   AST 31 07/14/2016 0910   ALT 37 03/16/2017 0839   ALT 37 07/14/2016 0910   BILITOT 0.70 03/16/2017 0839   BILITOT 0.61  07/14/2016 0910      Impression and Plan: [name] is a very pleasant 51 yo caucasian gentleman with stage II seminoma of the left testicle. He completed adjuvant radiation therapy and tolerated this well.  CT scan today showed no evidence of active or recurrent malignancy. He is doing well and has no complaints at this time.  He will continue on his same regimen of Coumadin.  We will plan to see him back in 4 months for repeat lab work, follow-up and scans.  He promises to contact our office with any questions or concerns. We can certainly see him sooner if need be.   Eliezer Bottom, NP 7/12/20186:38 PM

## 2017-03-16 NOTE — Telephone Encounter (Addendum)
I called the patient to review our plans for follow up along with Dr. Marin Olp and I will see him in August. His scan was negative for adenopathy and I encouraged him to call back to discuss this if he had questions.

## 2017-03-17 LAB — BETA HCG QUANT (REF LAB): hCG Quant: <1 m[IU]/mL (ref 0–3)

## 2017-03-17 LAB — AFP TUMOR MARKER: AFP, Serum, Tumor Marker: 4.1 ng/mL (ref 0.0–8.3)

## 2017-03-21 ENCOUNTER — Telehealth: Payer: Self-pay | Admitting: *Deleted

## 2017-03-21 NOTE — Telephone Encounter (Addendum)
Patient is aware of results  ----- Message from [name], MD sent at 03/19/2017  1:35 PM EDT ----- Call - NO cancer on the scan!!!  pete

## 2017-04-18 NOTE — Progress Notes (Signed)
Connor Howell 51 y.o. man with Stage IIB seminoma radiation completed 08-30-16 6 month FU.   Pain:Denies having pain. Appetite:Good. Bladder issues:No change in bladder pattern every two to three hours. Nausea/Vomiting:Denies nausea or vomiting. Fatigue denies feeling fatigue able to do his normal daily routine. Wt Readings from Last 3 Encounters:  04/25/17 196 lb 3.2 oz (89 kg)  03/16/17 193 lb (87.5 kg)  11/10/16 206 lb 4 oz (93.6 kg)  BP 138/76   Pulse (!) 59   Temp 98.2 F (36.8 C) (Oral)   Resp 18   Ht 5\' 10"  (1.778 m)   Wt 196 lb 3.2 oz (89 kg)   SpO2 99%   BMI 28.15 kg/m

## 2017-04-25 ENCOUNTER — Telehealth: Payer: Self-pay | Admitting: *Deleted

## 2017-04-25 ENCOUNTER — Encounter: Payer: Self-pay | Admitting: Radiation Oncology

## 2017-04-25 ENCOUNTER — Ambulatory Visit
Admission: RE | Admit: 2017-04-25 | Discharge: 2017-04-25 | Disposition: A | Discharge status: Home / Self Care | Payer: 59 | Source: Ambulatory Visit | Attending: Radiation Oncology | Admitting: Radiation Oncology

## 2017-04-25 ENCOUNTER — Other Ambulatory Visit: Payer: Self-pay | Admitting: Radiation Oncology

## 2017-04-25 VITALS — BP 138/76 | HR 59 | Temp 98.2°F | Resp 18 | Ht 70.0 in | Wt 196.2 lb

## 2017-04-25 DIAGNOSIS — Z7901 Long term (current) use of anticoagulants: Secondary | ICD-10-CM | POA: Diagnosis not present

## 2017-04-25 DIAGNOSIS — Z86718 Personal history of other venous thrombosis and embolism: Secondary | ICD-10-CM | POA: Insufficient documentation

## 2017-04-25 DIAGNOSIS — C6292 Malignant neoplasm of left testis, unspecified whether descended or undescended: Secondary | ICD-10-CM | POA: Diagnosis not present

## 2017-04-25 DIAGNOSIS — R590 Localized enlarged lymph nodes: Secondary | ICD-10-CM | POA: Insufficient documentation

## 2017-04-25 DIAGNOSIS — Z08 Encounter for follow-up examination after completed treatment for malignant neoplasm: Secondary | ICD-10-CM | POA: Diagnosis not present

## 2017-04-25 NOTE — Telephone Encounter (Signed)
CALLED PATIENT TO INFORM OF FU APPT. WITH ALISON PERKINS ON 11-01-17 @ 10:30 AM, SPOKE WITH PATIENT AND HE IS AWARE OF THIS APPT.

## 2017-04-25 NOTE — Progress Notes (Addendum)
Radiation Oncology         [phone] ________________________________  Name: Connor Howell MRN: [id number]  Date: 04/25/2017  DOB: Feb 22, 1966  Post Treatment Note  CC: Connor Cruel, MD  Connor Retort, MD  Diagnosis:   51 y.o. 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  The paraaortic and ipsiliateral pelvic nodes were treated to 20 Gy in 10 fractions 2.  The involved enlarged lymph node was treated to 36 Gy with 8 additional fractions  Narrative:  Connor Howell is a pleasant 51 y.o. gentleman with a history of Stage IIB pure seminoma who presented with scrotal swelling and a palpable mass of the testicle on the left side. This was present for about 2 months prior to his presentation. A scrotal ultrasound on 03/11/16 revealed a 3.2 x 2.0 x 3.1 cm solid mass of the left testicle with blood flow and micro calcifications concerning for  testicular malignancy, and the right testicle was normal. His AFP on 03/16/17 was 3.8, and beta HCG was <1.  He proceeded to left radical orchiectomy with Dr. Pilar Howell on 03/21/16.  Pathology showed a 3.2 cm seminoma with LVI and involvement of the rete testis.  CT abdomen/pelvis on 04/12/16 showed an isolated 1.1 cm left retroperitoneal node suspicious for isolated nodal metastasis. The patient proceeded with repeat CT abdomen/pelvis on  07/14/17 which showed this left retroperitoneal node to measure 2.1 cm. He elected for radiotherapy which was completed at the end of December 2017. He tolerated treatment and has been followed in surveillance. His CT in March 2018 of the chest/abd/pelvis revealed no new disease, and the treated node was noted to resolve completely, and the node was normal in dimension at 4-5 mm. On 03/16/17 the CT Abd/Pelvis was repeated and did not reveal any concerns for recurrent disease. He does have right foraminal impingement at L5-S1 due to degenerative disc  disease. He comes today for follow up. He was last seen and had a clinical exam that was negative in July as well with Connor Loose, NP. Of note since surgery his AFP has remained 4.2 or less, and his Beta HCG has been <1.    On review of systems, the patient reports that he is doing well overall. He's lost 10 pounds intentionally at the recommendation of his PCP. He is feeling well and reports he continues to eat a well balanced diet and avoids sweets. He denies any chest pain, shortness of breath, cough, fevers, chills, night sweats, unintended weight changes. He denies any bowel or bladder disturbances, and denies nausea or vomiting. Intermittently without any pattern, he notes some RLQ discomfort without specific symptoms of bowel changes or palpable changes when this occurs. He reports this resolves without intervention. He denies any new lower extremity edema, or palpable mass within the left scrotal sac. He denies any new musculoskeletal or joint aches or pains, new skin lesions or concerns. A complete review of systems is obtained and is otherwise negative.  Past Medical History:  Past Medical History:  Diagnosis Date  . Anticoagulated on Coumadin   . Cancer ([place]) 03/21/2016   seminoma left testis/testicular cancer  . History of DVT of lower extremity    first dx 2001  right lower leg w/ recurrency   . Protein S deficiency ([place])   . Testicular mass    LEFT    Past Surgical History: Past Surgical History:  Procedure  Laterality Date  . KNEE ARTHROSCOPY W/ ACL RECONSTRUCTION Right 2003 approx  . ORCHIECTOMY Left 03/21/2016   Procedure: LEFT RADICAL ORCHIECTOMY;  Surgeon: Connor Retort, MD;  [facility];  Service: Urology;  Laterality: Left;    Social History:  Social History   Social History  . Marital status: Married    Spouse name: N/A  . Number of children: N/A  . Years of education: N/A   Occupational History  . Not on file.   Social  History Main Topics  . Smoking status: Never Smoker  . Smokeless tobacco: Never Used  . Alcohol use 0.0 oz/week     Comment: Moderate drinker  . Drug use: No  . Sexual activity: Yes   Other Topics Concern  . Not on file   Social History Narrative  . No narrative on file    Family History: Family History  Problem Relation Age of Onset  . Cancer Neg Hx     ALLERGIES:  is allergic to epinephrine.  Meds: Current Outpatient Prescriptions  Medication Sig Dispense Refill  . Multiple Vitamin (MULTIVITAMIN) tablet Take 1 tablet by mouth daily.    Marland Kitchen warfarin (COUMADIN) 5 MG tablet TAKE 1 TABLET BY MOUTH EVERY DAY (Patient taking differently: TAKE 1/2 TABLET, 2.5 mg  BY MOUTH EVERY DAY) 30 tablet 3   No current facility-administered medications for this encounter.     Physical Findings:  height is 5\' 10"  (1.778 m) and weight is 196 lb 3.2 oz (89 kg). His oral temperature is 98.2 F (36.8 C). His blood pressure is 138/76 and his pulse is 59 (abnormal). His respiration is 18 and oxygen saturation is 99%.  Pain Assessment Pain Score: 0-No pain/10 In general this is a well appearing caucasian male in no acute distress. He is alert and oriented x4 and appropriate throughout the examination. HEENT reveals that the patient is normocephalic, atraumatic. EOMs are intact. PERRLA. Skin is intact without any evidence of gross lesions. Cardiovascular exam reveals a regular rate and rhythm, no clicks rubs or murmurs are auscultated. Chest is clear to auscultation bilaterally. Lymphatic assessment is performed and does not reveal any adenopathy in the cervical, supraclavicular, axillary, or inguinal chains. Abdomen has active bowel sounds in all quadrants and is intact. The abdomen is soft, non tender, non distended. Lower extremities assessed. There is trace right lower extremity edema consistent with his known history of prior DVT. On the left, there is no pretibial pitting edema, deep calf tenderness,  cyanosis or clubbing. GU exam reveals normal appearing male genitalia. The glans is intact and the patient is circumcised. No palpable masses are noted in the left scrotum or up into the inguinal ring. No masses are appreciated of the right testicle or scrotum, nor of the inguinal ring.   Lab Findings: Lab Results  Component Value Date   WBC 3.4 (L) 03/16/2017   HGB 13.9 03/16/2017   HCT 40.7 03/16/2017   MCV 90 03/16/2017   PLT 147 03/16/2017     Radiographic Findings: No results found.  Impression/Plan: 1. 51 y.o. 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 The patient appears to be doing well clinically and appears to be NED. We will plan to follow along with his imaging which is due in November. Provided he's radiographically doing well, I'll plan to see him back in 6 months around February 2019, and again in August 2019, then annually moving forward. He states agreement and understanding. I reviewed the NCCN  guidelines for follow up as well, and at the end of our visit, all questions were answered to the patient's satisfaction.   2. Prostate Cancer screening. We discussed checking PSA levels at age 25 annually with our office, Dr. Antonieta Pert office, or Dr. Carlynn Purl office. We will make more definitive plans for this at his next visit. 3. History of DVT. The patient will follow up with Dr. Marin Olp and Clarise Cruz and will continue coumadin as previously outlined.     Carola Rhine, PAC

## 2017-04-25 NOTE — Progress Notes (Signed)
Please schedule his scans in November, and to see Carney Living in November as well. Thanks, Bryson Ha

## 2017-06-27 ENCOUNTER — Other Ambulatory Visit (HOSPITAL_BASED_OUTPATIENT_CLINIC_OR_DEPARTMENT_OTHER): Payer: 59

## 2017-06-27 ENCOUNTER — Ambulatory Visit (HOSPITAL_BASED_OUTPATIENT_CLINIC_OR_DEPARTMENT_OTHER): Payer: 59 | Admitting: Family

## 2017-06-27 VITALS — BP 133/79 | Temp 98.2°F | Resp 1 | Wt 193.5 lb

## 2017-06-27 DIAGNOSIS — C6292 Malignant neoplasm of left testis, unspecified whether descended or undescended: Secondary | ICD-10-CM

## 2017-06-27 DIAGNOSIS — D6859 Other primary thrombophilia: Secondary | ICD-10-CM

## 2017-06-27 DIAGNOSIS — I82401 Acute embolism and thrombosis of unspecified deep veins of right lower extremity: Secondary | ICD-10-CM

## 2017-06-27 LAB — PROTIME-INR (CHCC SATELLITE)
INR: 2.4 (ref 2.0–3.5)
PROTIME: 28.8 s — AB (ref 10.6–13.4)

## 2017-06-27 LAB — CMP (CANCER CENTER ONLY)
ALBUMIN: 3.9 g/dL (ref 3.3–5.5)
ALT(SGPT): 45 U/L (ref 10–47)
AST: 38 U/L (ref 11–38)
Alkaline Phosphatase: 68 U/L (ref 26–84)
BILIRUBIN TOTAL: 0.6 mg/dL (ref 0.20–1.60)
BUN: 16 mg/dL (ref 7–22)
CALCIUM: 9.7 mg/dL (ref 8.0–10.3)
CHLORIDE: 105 meq/L (ref 98–108)
CO2: 31 meq/L (ref 18–33)
CREATININE: 1 mg/dL (ref 0.6–1.2)
Glucose, Bld: 104 mg/dL (ref 73–118)
Potassium: 4.7 mEq/L (ref 3.3–4.7)
Sodium: 146 mEq/L — ABNORMAL HIGH (ref 128–145)
Total Protein: 7.8 g/dL (ref 6.4–8.1)

## 2017-06-27 LAB — CBC WITH DIFFERENTIAL (CANCER CENTER ONLY)
BASO#: 0 10*3/uL (ref 0.0–0.2)
BASO%: 0.3 % (ref 0.0–2.0)
EOS ABS: 0.1 10*3/uL (ref 0.0–0.5)
EOS%: 2.1 % (ref 0.0–7.0)
HEMATOCRIT: 41.3 % (ref 38.7–49.9)
HGB: 14.1 g/dL (ref 13.0–17.1)
LYMPH#: 1.1 10*3/uL (ref 0.9–3.3)
LYMPH%: 27.6 % (ref 14.0–48.0)
MCH: 30.7 pg (ref 28.0–33.4)
MCHC: 34.1 g/dL (ref 32.0–35.9)
MCV: 90 fL (ref 82–98)
MONO#: 0.4 10*3/uL (ref 0.1–0.9)
MONO%: 9.7 % (ref 0.0–13.0)
NEUT#: 2.3 10*3/uL (ref 1.5–6.5)
NEUT%: 60.3 % (ref 40.0–80.0)
PLATELETS: 158 10*3/uL (ref 145–400)
RBC: 4.59 10*6/uL (ref 4.20–5.70)
RDW: 12.2 % (ref 11.1–15.7)
WBC: 3.8 10*3/uL — ABNORMAL LOW (ref 4.0–10.0)

## 2017-06-27 LAB — LACTATE DEHYDROGENASE: LDH: 262 U/L — AB (ref 125–245)

## 2017-06-27 NOTE — Progress Notes (Signed)
Hematology and Oncology Follow Up Visit  Connor Howell [id number] Feb 02, 1966 51 y.o. 06/27/2017   Principle Diagnosis:  Stage II seminoma of the left testicle Recurrent DVT of the right leg Protein S deficiency  Past Therapy: Left radical orchiectomy Radiation Therapy - completed 08/2016  Current Therapy:   Coumadin to obtain INR between 2-3   Interim History:  [name] is here today for follow-up. He is doing well and has no complaints at this time. So far, his tumor markers have remained stable. His levels today are pending.  He has started fenofibrate so we will need to closely monitor his INR on Coumadin. His INR today is therapeutic at 2.4.  He is also due for repeat scans in the next week or so and will have these scheduled today.  His exam today was negative. No lymphadenopathy.  No episodes of bleeding, bruising or petechiae.  No fever, chills, n/v, cough, rash, dizziness, SOB, chest pain, palpitations, abdominal pain or changes in bowel or bladder habits.  No swelling, tenderness, numbness or tingling in her extremities. No c/o pain at this time.  He wears compression stockings daily for support.  He has maintained a good appetite and is staying well hydrated. His weight is stable.   ECOG Performance Status: 0 - Asymptomatic  Medications:  Allergies as of 06/27/2017      Reactions   Epinephrine Other (See Comments)      Medication List       Accurate as of 06/27/17 10:50 AM. Always use your most recent med list.          amoxicillin 500 MG capsule Commonly known as:  AMOXIL Take 500 mg by mouth 3 (three) times daily. Infected tooth   fenofibrate 54 MG tablet Take 54 mg by mouth daily. with food   multivitamin tablet Take 1 tablet by mouth daily.   warfarin 5 MG tablet Commonly known as:  COUMADIN TAKE 1 TABLET BY MOUTH EVERY DAY       Allergies:  Allergies  Allergen Reactions  . Epinephrine Other (See Comments)    Past Medical  History, Surgical history, Social history, and Family History were reviewed and updated.  Review of Systems: All other 10 point review of systems is negative.   Physical Exam:  weight is 193 lb 8 oz (87.8 kg). His oral temperature is 98.2 F (36.8 C). His blood pressure is 133/79. His respiration is 1 (abnormal) and oxygen saturation is 100%.   Wt Readings from Last 3 Encounters:  06/27/17 193 lb 8 oz (87.8 kg)  04/25/17 196 lb 3.2 oz (89 kg)  03/16/17 193 lb (87.5 kg)    Ocular: Sclerae unicteric, pupils equal, round and reactive to light Ear-nose-throat: Oropharynx clear, dentition fair Lymphatic: No axillary, cervical, supraclavicular or inguinal adenopathy Lungs no rales or rhonchi, good excursion bilaterally Heart regular rate and rhythm, no murmur appreciated Abd soft, nontender, positive bowel sounds, no liver or spleen tip palpated on exam, no fluid wave  MSK no focal spinal tenderness, no joint edema Neuro: non-focal, well-oriented, appropriate affect Breasts: Deferred   Lab Results  Component Value Date   WBC 3.8 (L) 06/27/2017   HGB 14.1 06/27/2017   HCT 41.3 06/27/2017   MCV 90 06/27/2017   PLT 158 06/27/2017   No results found for: FERRITIN, IRON, TIBC, UIBC, IRONPCTSAT Lab Results  Component Value Date   RBC 4.59 06/27/2017   No results found for: KPAFRELGTCHN, LAMBDASER, KAPLAMBRATIO No results found for: IGGSERUM, IGA,  IGMSERUM No results found for: Odetta Pink, SPEI   Chemistry      Component Value Date/Time   NA 140 03/16/2017 0839   NA 139 07/14/2016 0910   K 4.3 03/16/2017 0839   K 4.5 07/14/2016 0910   CL 103 03/16/2017 0839   CO2 29 03/16/2017 0839   CO2 27 07/14/2016 0910   BUN 13 03/16/2017 0839   BUN 12.9 07/14/2016 0910   CREATININE 1.1 03/16/2017 0839   CREATININE 0.9 07/14/2016 0910      Component Value Date/Time   CALCIUM 8.7 03/16/2017 0839   CALCIUM 9.6 07/14/2016 0910    ALKPHOS 66 03/16/2017 0839   ALKPHOS 83 07/14/2016 0910   AST 32 03/16/2017 0839   AST 31 07/14/2016 0910   ALT 37 03/16/2017 0839   ALT 37 07/14/2016 0910   BILITOT 0.70 03/16/2017 0839   BILITOT 0.61 07/14/2016 0910      Impression and Plan: [name] is a very pleasant 51 yo caucasian gentleman with history of stage II seminoma of the left testicle. He had a left radical orchiectomy followed by radiation completed in December 2017. So far he has done well and there has been no evidence of recurrence.  We will check his labs every 2 weeks for now to monitor the INR. He will continue on his same Coumadin regimen for now.  We will repeat CT scan next week.  We will plan to see him back again in another 3 months for repeat lab work and follow-up.  He is in agreement with the plan and will contact our office with any questions or concerns. We can certainly see him sooner if need be.   Eliezer Bottom, NP 10/23/201810:50 AM

## 2017-06-28 LAB — BETA HCG QUANT (REF LAB): hCG Quant: <1 m[IU]/mL (ref 0–3)

## 2017-06-28 LAB — AFP TUMOR MARKER: AFP, Serum, Tumor Marker: 3.2 ng/mL (ref 0.0–8.3)

## 2017-07-06 DIAGNOSIS — Z23 Encounter for immunization: Secondary | ICD-10-CM | POA: Diagnosis not present

## 2017-07-10 ENCOUNTER — Other Ambulatory Visit: Payer: Self-pay

## 2017-07-10 DIAGNOSIS — I82411 Acute embolism and thrombosis of right femoral vein: Secondary | ICD-10-CM

## 2017-07-11 ENCOUNTER — Other Ambulatory Visit (HOSPITAL_BASED_OUTPATIENT_CLINIC_OR_DEPARTMENT_OTHER): Payer: 59

## 2017-07-11 DIAGNOSIS — I82401 Acute embolism and thrombosis of unspecified deep veins of right lower extremity: Secondary | ICD-10-CM

## 2017-07-11 DIAGNOSIS — D6859 Other primary thrombophilia: Secondary | ICD-10-CM | POA: Diagnosis not present

## 2017-07-11 DIAGNOSIS — C6292 Malignant neoplasm of left testis, unspecified whether descended or undescended: Secondary | ICD-10-CM

## 2017-07-11 DIAGNOSIS — I82411 Acute embolism and thrombosis of right femoral vein: Secondary | ICD-10-CM

## 2017-07-11 LAB — CBC WITH DIFFERENTIAL (CANCER CENTER ONLY)
BASO#: 0 10*3/uL (ref 0.0–0.2)
BASO%: 0.4 % (ref 0.0–2.0)
EOS ABS: 0.1 10*3/uL (ref 0.0–0.5)
EOS%: 3.1 % (ref 0.0–7.0)
HCT: 42.1 % (ref 38.7–49.9)
HEMOGLOBIN: 14.3 g/dL (ref 13.0–17.1)
LYMPH#: 1.3 10*3/uL (ref 0.9–3.3)
LYMPH%: 29.3 % (ref 14.0–48.0)
MCH: 30.3 pg (ref 28.0–33.4)
MCHC: 34 g/dL (ref 32.0–35.9)
MCV: 89 fL (ref 82–98)
MONO#: 0.4 10*3/uL (ref 0.1–0.9)
MONO%: 8.9 % (ref 0.0–13.0)
NEUT%: 58.3 % (ref 40.0–80.0)
NEUTROS ABS: 2.6 10*3/uL (ref 1.5–6.5)
Platelets: 185 10*3/uL (ref 145–400)
RBC: 4.72 10*6/uL (ref 4.20–5.70)
RDW: 12.2 % (ref 11.1–15.7)
WBC: 4.5 10*3/uL (ref 4.0–10.0)

## 2017-07-11 LAB — PROTIME-INR (CHCC SATELLITE)
INR: 2.3 (ref 2.0–3.5)
PROTIME: 27.6 s — AB (ref 10.6–13.4)

## 2017-07-13 ENCOUNTER — Ambulatory Visit (HOSPITAL_BASED_OUTPATIENT_CLINIC_OR_DEPARTMENT_OTHER): Payer: 59

## 2017-07-13 ENCOUNTER — Ambulatory Visit (HOSPITAL_BASED_OUTPATIENT_CLINIC_OR_DEPARTMENT_OTHER)
Admission: RE | Admit: 2017-07-13 | Discharge: 2017-07-13 | Disposition: A | Discharge status: Home / Self Care | Payer: 59 | Source: Ambulatory Visit | Attending: Family | Admitting: Family

## 2017-07-13 ENCOUNTER — Encounter (HOSPITAL_BASED_OUTPATIENT_CLINIC_OR_DEPARTMENT_OTHER): Payer: Self-pay

## 2017-07-13 DIAGNOSIS — K5730 Diverticulosis of large intestine without perforation or abscess without bleeding: Secondary | ICD-10-CM | POA: Diagnosis not present

## 2017-07-13 DIAGNOSIS — M5187 Other intervertebral disc disorders, lumbosacral region: Secondary | ICD-10-CM | POA: Insufficient documentation

## 2017-07-13 DIAGNOSIS — Z8547 Personal history of malignant neoplasm of testis: Secondary | ICD-10-CM | POA: Insufficient documentation

## 2017-07-13 DIAGNOSIS — I82401 Acute embolism and thrombosis of unspecified deep veins of right lower extremity: Secondary | ICD-10-CM | POA: Diagnosis present

## 2017-07-13 DIAGNOSIS — K5732 Diverticulitis of large intestine without perforation or abscess without bleeding: Secondary | ICD-10-CM | POA: Diagnosis not present

## 2017-07-13 DIAGNOSIS — D6859 Other primary thrombophilia: Secondary | ICD-10-CM | POA: Diagnosis present

## 2017-07-13 DIAGNOSIS — Z9079 Acquired absence of other genital organ(s): Secondary | ICD-10-CM | POA: Diagnosis not present

## 2017-07-13 DIAGNOSIS — C6292 Malignant neoplasm of left testis, unspecified whether descended or undescended: Secondary | ICD-10-CM

## 2017-07-13 MED ORDER — IOPAMIDOL (ISOVUE-300) INJECTION 61%
100.0000 mL | Freq: Once | INTRAVENOUS | Status: AC | PRN
  Administered 2017-07-13: 100 mL via INTRAVENOUS

## 2017-07-14 ENCOUNTER — Telehealth: Payer: Self-pay | Admitting: Family

## 2017-07-14 ENCOUNTER — Telehealth: Payer: Self-pay | Admitting: Radiation Oncology

## 2017-07-14 NOTE — Telephone Encounter (Signed)
-----   Message from Hayden Pedro, [place] sent at 07/13/2017 10:26 AM EST ----- Regarding: FW: 07/15/17 Since he was originally a Dr. [name] patient, would you mind just letting him know his scan he had looks good to me and that he will review this further when he goes to see Dr. Marin Olp. I'll see him back next go around in February. Now that he's a year out, we would recommend a scan every 6-12 months. Thanks, Bryson Ha  ----- Message ----- From: Hayden Pedro, PA-C Sent: 07/06/2017   8:07 AM To: Hayden Pedro, PA-C Subject: 07/15/17                                         ----- Message ----- From: Hayden Pedro, PA-C Sent: 04/25/2017  11:57 AM To: Hayden Pedro, PA-C Subject: 06/29/17                                       Look to see if he's schedule for CT in November- due on 07/17/17 and to see Ennever/Sarah then. Call also to review with him once scan is done.

## 2017-07-14 NOTE — Telephone Encounter (Signed)
As requested by Shona Simpson, PA-C I phoned the patient. Explained that his scan (CT of abd and pelvis) looks good and it will be reviewed further when he sees Dr. Marin Olp or his NP. Explained that Shona Simpson, PA-C will see him back next go around in [place]. In addition, explained that now that he is a year out she recommends a scan every 6-12 months. Patient questioned when his CT chest will be done. Explained that prior authorization from insurance has yet to be obtained. Encouraged him to follow up with Dr. Antonieta Pert office on Tuesday of the coming week. Patient verbalized understanding of all reviewed and expressed appreciation for the call.

## 2017-07-14 NOTE — Telephone Encounter (Signed)
I spoke with [name] and went over his CT scan results in detail. He states that he has been told in the past that he has a bulging disc in his lower back and that he has no pain other than his normal aches that comes and go. He denies any lower back pain at this time. No incontinence or weakness in the legs. All questions were answered and we will plan to see him again in January on the 15th.

## 2017-07-19 ENCOUNTER — Other Ambulatory Visit: Payer: Self-pay | Admitting: Hematology & Oncology

## 2017-07-25 ENCOUNTER — Other Ambulatory Visit (HOSPITAL_BASED_OUTPATIENT_CLINIC_OR_DEPARTMENT_OTHER): Payer: 59

## 2017-07-25 DIAGNOSIS — I82401 Acute embolism and thrombosis of unspecified deep veins of right lower extremity: Secondary | ICD-10-CM

## 2017-07-25 DIAGNOSIS — D6859 Other primary thrombophilia: Secondary | ICD-10-CM

## 2017-07-25 DIAGNOSIS — C6292 Malignant neoplasm of left testis, unspecified whether descended or undescended: Secondary | ICD-10-CM

## 2017-07-25 LAB — PROTIME-INR (CHCC SATELLITE)
INR: 2.3 (ref 2.0–3.5)
Protime: 27.6 Seconds — ABNORMAL HIGH (ref 10.6–13.4)

## 2017-08-08 ENCOUNTER — Other Ambulatory Visit (HOSPITAL_BASED_OUTPATIENT_CLINIC_OR_DEPARTMENT_OTHER): Payer: 59

## 2017-08-08 DIAGNOSIS — C6292 Malignant neoplasm of left testis, unspecified whether descended or undescended: Secondary | ICD-10-CM

## 2017-08-08 DIAGNOSIS — I82401 Acute embolism and thrombosis of unspecified deep veins of right lower extremity: Secondary | ICD-10-CM

## 2017-08-08 DIAGNOSIS — D6859 Other primary thrombophilia: Secondary | ICD-10-CM

## 2017-08-08 LAB — PROTIME-INR (CHCC SATELLITE)
INR: 2.4 (ref 2.0–3.5)
Protime: 28.8 Seconds — ABNORMAL HIGH (ref 10.6–13.4)

## 2017-08-22 ENCOUNTER — Other Ambulatory Visit (HOSPITAL_BASED_OUTPATIENT_CLINIC_OR_DEPARTMENT_OTHER): Payer: 59

## 2017-08-22 DIAGNOSIS — C6292 Malignant neoplasm of left testis, unspecified whether descended or undescended: Secondary | ICD-10-CM

## 2017-08-22 DIAGNOSIS — D6859 Other primary thrombophilia: Secondary | ICD-10-CM

## 2017-08-22 DIAGNOSIS — I82401 Acute embolism and thrombosis of unspecified deep veins of right lower extremity: Secondary | ICD-10-CM | POA: Diagnosis not present

## 2017-08-22 LAB — PROTIME-INR (CHCC SATELLITE)
INR: 2.8 (ref 2.0–3.5)
Protime: 33.6 Seconds — ABNORMAL HIGH (ref 10.6–13.4)

## 2017-09-06 ENCOUNTER — Other Ambulatory Visit (HOSPITAL_BASED_OUTPATIENT_CLINIC_OR_DEPARTMENT_OTHER): Payer: 59

## 2017-09-06 DIAGNOSIS — I82401 Acute embolism and thrombosis of unspecified deep veins of right lower extremity: Secondary | ICD-10-CM

## 2017-09-06 DIAGNOSIS — C6292 Malignant neoplasm of left testis, unspecified whether descended or undescended: Secondary | ICD-10-CM

## 2017-09-06 DIAGNOSIS — D6859 Other primary thrombophilia: Secondary | ICD-10-CM

## 2017-09-06 LAB — PROTIME-INR (CHCC SATELLITE)
INR: 1.9 — AB (ref 2.0–3.5)
Protime: 22.8 Seconds — ABNORMAL HIGH (ref 10.6–13.4)

## 2017-09-15 DIAGNOSIS — R030 Elevated blood-pressure reading, without diagnosis of hypertension: Secondary | ICD-10-CM | POA: Diagnosis not present

## 2017-09-15 DIAGNOSIS — E785 Hyperlipidemia, unspecified: Secondary | ICD-10-CM | POA: Diagnosis not present

## 2017-09-15 DIAGNOSIS — D6859 Other primary thrombophilia: Secondary | ICD-10-CM | POA: Diagnosis not present

## 2017-09-19 ENCOUNTER — Inpatient Hospital Stay: Payer: 59

## 2017-09-19 ENCOUNTER — Other Ambulatory Visit: Payer: Self-pay

## 2017-09-19 ENCOUNTER — Inpatient Hospital Stay: Payer: 59 | Attending: Family | Admitting: Family

## 2017-09-19 VITALS — BP 134/71 | HR 59 | Temp 97.7°F | Resp 20 | Wt 196.0 lb

## 2017-09-19 DIAGNOSIS — I82401 Acute embolism and thrombosis of unspecified deep veins of right lower extremity: Secondary | ICD-10-CM | POA: Insufficient documentation

## 2017-09-19 DIAGNOSIS — D6859 Other primary thrombophilia: Secondary | ICD-10-CM

## 2017-09-19 DIAGNOSIS — Z7901 Long term (current) use of anticoagulants: Secondary | ICD-10-CM | POA: Diagnosis not present

## 2017-09-19 DIAGNOSIS — C6292 Malignant neoplasm of left testis, unspecified whether descended or undescended: Secondary | ICD-10-CM | POA: Diagnosis not present

## 2017-09-19 LAB — CBC WITH DIFFERENTIAL (CANCER CENTER ONLY)
Basophils Absolute: 0 10*3/uL (ref 0.0–0.1)
Basophils Relative: 1 %
EOS ABS: 0.1 10*3/uL (ref 0.0–0.5)
EOS PCT: 2 %
HCT: 39.9 % (ref 38.7–49.9)
Hemoglobin: 13.4 g/dL (ref 13.0–17.1)
LYMPHS ABS: 1 10*3/uL (ref 0.9–3.3)
Lymphocytes Relative: 27 %
MCH: 30.5 pg (ref 28.0–33.4)
MCHC: 33.6 g/dL (ref 32.0–35.9)
MCV: 90.9 fL (ref 82.0–98.0)
MONOS PCT: 10 %
Monocytes Absolute: 0.4 10*3/uL (ref 0.1–0.9)
Neutro Abs: 2.3 10*3/uL (ref 1.5–6.5)
Neutrophils Relative %: 60 %
PLATELETS: 206 10*3/uL (ref 140–400)
RBC: 4.39 MIL/uL (ref 4.20–5.70)
RDW: 13 % (ref 11.1–15.7)
WBC Count: 3.7 10*3/uL — ABNORMAL LOW (ref 4.0–10.3)

## 2017-09-19 LAB — CMP (CANCER CENTER ONLY)
ALK PHOS: 57 U/L (ref 40–150)
ALT: 31 U/L (ref 0–55)
ANION GAP: 8 (ref 3–11)
AST: 31 U/L (ref 5–34)
Albumin: 4.4 g/dL (ref 3.5–5.0)
BUN: 19 mg/dL (ref 7–26)
CALCIUM: 9.4 mg/dL (ref 8.4–10.4)
CO2: 27 mmol/L (ref 22–29)
Chloride: 105 mmol/L (ref 98–109)
Creatinine: 1.1 mg/dL (ref 0.70–1.30)
GFR, Estimated: >60 mL/min (ref >60)
Glucose, Bld: 86 mg/dL (ref 70–140)
Potassium: 4.9 mmol/L (ref 3.5–5.1)
SODIUM: 140 mmol/L (ref 136–145)
Total Bilirubin: 0.5 mg/dL (ref 0.2–1.2)
Total Protein: 7.6 g/dL (ref 6.4–8.3)

## 2017-09-19 LAB — PROTIME-INR
INR: 2.45
PROTHROMBIN TIME: 26.4 s — AB (ref 11.4–15.2)

## 2017-09-19 LAB — LACTATE DEHYDROGENASE: LDH: 234 U/L (ref 125–245)

## 2017-09-19 NOTE — Progress Notes (Signed)
Hematology and Oncology Follow Up Visit  Connor Howell [id number] Jan 18, 1966 52 y.o. 09/19/2017   Principle Diagnosis:  Stage II seminoma of the left testicle Recurrent DVT of the right leg Protein S deficiency  Past Therapy: Left radical orchiectomy Radiation Therapy - completed 08/2016  Current Therapy:   Coumadin to obtain INR between 2-3    Interim History:  Connor Howell is here today for follow-up. He is doing well and has no complaints at this time.  He is taking his Coumadin 2.5 mg PO daily as prescribed. INR is therapeutic at 2.45.  No episodes of bleeding, bruising or petechiae. No lymphadenopathy found on exam.  No fever, chills, n/v, cough, rash, dizziness, SOB, chest pain, palpitations, abdominal pain or changes in his bowel or bladder habits.  No swelling, tenderness, numbness or tingling in her extremities. No c/o pain.  He has maintained a good appetite and is staying well hydrated. His weight is stable.   ECOG Performance Status: 0 - Asymptomatic  Medications:  Allergies as of 09/19/2017      Reactions   Epinephrine Other (See Comments)      Medication List        Accurate as of 09/19/17  9:52 AM. Always use your most recent med list.          amoxicillin 500 MG capsule Commonly known as:  AMOXIL Take 500 mg by mouth 3 (three) times daily. Infected tooth   fenofibrate 54 MG tablet Take 54 mg by mouth daily. with food   multivitamin tablet Take 1 tablet by mouth daily.   warfarin 5 MG tablet Commonly known as:  COUMADIN TAKE 1 TABLET BY MOUTH EVERY DAY       Allergies:  Allergies  Allergen Reactions  . Epinephrine Other (See Comments)    Past Medical History, Surgical history, Social history, and Family History were reviewed and updated.  Review of Systems: All other 10 point review of systems is negative.   Physical Exam:  vitals were not taken for this visit.   Wt Readings from Last 3 Encounters:  06/27/17 193 lb 8 oz (87.8  kg)  04/25/17 196 lb 3.2 oz (89 kg)  03/16/17 193 lb (87.5 kg)    Ocular: Sclerae unicteric, pupils equal, round and reactive to light Ear-nose-throat: Oropharynx clear, dentition fair Lymphatic: No cervical, supraclavicular or axillary adenopathy Lungs no rales or rhonchi, good excursion bilaterally Heart regular rate and rhythm, no murmur appreciated Abd soft, nontender, positive bowel sounds, no liver or spleen tip palpated on exam, no fluid wave  MSK no focal spinal tenderness, no joint edema Neuro: non-focal, well-oriented, appropriate affect Breasts: Deferred   Lab Results  Component Value Date   WBC 4.5 07/11/2017   HGB 14.3 07/11/2017   HCT 42.1 07/11/2017   MCV 89 07/11/2017   PLT 185 07/11/2017   No results found for: FERRITIN, IRON, TIBC, UIBC, IRONPCTSAT Lab Results  Component Value Date   RBC 4.72 07/11/2017   No results found for: KPAFRELGTCHN, LAMBDASER, KAPLAMBRATIO No results found for: IGGSERUM, IGA, IGMSERUM No results found for: Ronnald Ramp, A1GS, A2GS, Violet Baldy, MSPIKE, SPEI   Chemistry      Component Value Date/Time   NA 146 (H) 06/27/2017 1024   NA 139 07/14/2016 0910   K 4.7 06/27/2017 1024   K 4.5 07/14/2016 0910   CL 105 06/27/2017 1024   CO2 31 06/27/2017 1024   CO2 27 07/14/2016 0910   BUN 16 06/27/2017 1024  BUN 12.9 07/14/2016 0910   CREATININE 1.0 06/27/2017 1024   CREATININE 0.9 07/14/2016 0910      Component Value Date/Time   CALCIUM 9.7 06/27/2017 1024   CALCIUM 9.6 07/14/2016 0910   ALKPHOS 68 06/27/2017 1024   ALKPHOS 83 07/14/2016 0910   AST 38 06/27/2017 1024   AST 31 07/14/2016 0910   ALT 45 06/27/2017 1024   ALT 37 07/14/2016 0910   BILITOT 0.60 06/27/2017 1024   BILITOT 0.61 07/14/2016 0910      Impression and Plan: Connor Howell is a very pleasant 52 yo caucasian gentleman with protein S deficiency with recurrent DVT. He also has history of stage II seminoma of the left testicle with left  radical orchiectomy followed by radiation. This was completed in December 2017. He continues to do well and has no complaints at this time.  His INR today is therapeutic at 2.45 so no changes to his Coumadin regimen at this time.  We will plan to repeat a CT scan of the abdomen and pelvis in May.  We will plan to see him back for follow-up in 3 months for follow-up. He will continue to have his INR checked every 2 weeks. He will contact our office with any questions or concerns. We can certainly see him sooner if need be.   Laverna Peace, NP 1/15/20199:52 AM

## 2017-09-20 LAB — AFP TUMOR MARKER: AFP, Serum, Tumor Marker: 4.1 ng/mL (ref 0.0–8.3)

## 2017-09-20 LAB — BETA 2 MICROGLOBULIN, SERUM: BETA 2 MICROGLOBULIN: 1.4 mg/L (ref 0.6–2.4)

## 2017-10-02 ENCOUNTER — Other Ambulatory Visit: Payer: Self-pay | Admitting: *Deleted

## 2017-10-02 DIAGNOSIS — I82401 Acute embolism and thrombosis of unspecified deep veins of right lower extremity: Secondary | ICD-10-CM

## 2017-10-03 ENCOUNTER — Telehealth: Payer: Self-pay | Admitting: *Deleted

## 2017-10-03 ENCOUNTER — Inpatient Hospital Stay: Payer: 59

## 2017-10-03 DIAGNOSIS — I82401 Acute embolism and thrombosis of unspecified deep veins of right lower extremity: Secondary | ICD-10-CM

## 2017-10-03 LAB — PROTIME-INR
INR: 2.17
Prothrombin Time: 24 seconds — ABNORMAL HIGH (ref 11.4–15.2)

## 2017-10-03 NOTE — Telephone Encounter (Addendum)
Patient is aware of results  ----- Message from [name], MD sent at 10/03/2017  9:57 AM EST ----- Call- INR is perfect!!!  Laurey Arrow

## 2017-10-17 ENCOUNTER — Inpatient Hospital Stay: Payer: 59 | Attending: Family

## 2017-10-17 DIAGNOSIS — C6292 Malignant neoplasm of left testis, unspecified whether descended or undescended: Secondary | ICD-10-CM

## 2017-10-17 DIAGNOSIS — D6859 Other primary thrombophilia: Secondary | ICD-10-CM | POA: Insufficient documentation

## 2017-10-17 DIAGNOSIS — I82401 Acute embolism and thrombosis of unspecified deep veins of right lower extremity: Secondary | ICD-10-CM | POA: Diagnosis present

## 2017-10-17 LAB — PROTIME-INR
INR: 1.81
Prothrombin Time: 20.8 seconds — ABNORMAL HIGH (ref 11.4–15.2)

## 2017-10-27 NOTE — Progress Notes (Signed)
Connor Howell 52 y.o. man with Stage IIB seminoma radiation completed 12-26-176 month FU.   Pain:No Appetite:Good Bladder issues:normal pattern usually 2-3 hours to void. Nausea/Vomiting:No Fatigue:No Wt Readings from Last 3 Encounters:  11/01/17 192 lb (87.1 kg)  09/19/17 196 lb (88.9 kg)  06/27/17 193 lb 8 oz (87.8 kg)  BP 126/75 (BP Location: Left Arm, Patient Position: Sitting, Cuff Size: Normal)   Pulse 62   Temp 97.7 F (36.5 C) (Oral)   Resp 20   Ht 5\' 10"  (1.778 m)   Wt 192 lb (87.1 kg)   SpO2 100%   BMI 27.55 kg/m

## 2017-10-31 ENCOUNTER — Inpatient Hospital Stay: Payer: 59

## 2017-10-31 ENCOUNTER — Telehealth: Payer: Self-pay | Admitting: *Deleted

## 2017-10-31 DIAGNOSIS — C6292 Malignant neoplasm of left testis, unspecified whether descended or undescended: Secondary | ICD-10-CM

## 2017-10-31 DIAGNOSIS — D6859 Other primary thrombophilia: Secondary | ICD-10-CM

## 2017-10-31 DIAGNOSIS — I82401 Acute embolism and thrombosis of unspecified deep veins of right lower extremity: Secondary | ICD-10-CM

## 2017-10-31 LAB — PROTIME-INR
INR: 1.79
Prothrombin Time: 20.6 seconds — ABNORMAL HIGH (ref 11.4–15.2)

## 2017-10-31 NOTE — Telephone Encounter (Addendum)
Patient is aware of results  ----- Message from [name], MD sent at 10/31/2017 11:36 AM EST ----- Call - INR is ok!!  Connor Howell

## 2017-11-01 ENCOUNTER — Ambulatory Visit
Admission: RE | Admit: 2017-11-01 | Discharge: 2017-11-01 | Disposition: A | Discharge status: Home / Self Care | Payer: 59 | Source: Ambulatory Visit | Attending: Radiation Oncology | Admitting: Radiation Oncology

## 2017-11-01 ENCOUNTER — Encounter: Payer: Self-pay | Admitting: Radiation Oncology

## 2017-11-01 VITALS — BP 126/75 | HR 62 | Temp 97.7°F | Resp 20 | Ht 70.0 in | Wt 192.0 lb

## 2017-11-01 DIAGNOSIS — Z79899 Other long term (current) drug therapy: Secondary | ICD-10-CM | POA: Insufficient documentation

## 2017-11-01 DIAGNOSIS — Z8547 Personal history of malignant neoplasm of testis: Secondary | ICD-10-CM | POA: Diagnosis present

## 2017-11-01 DIAGNOSIS — D6859 Other primary thrombophilia: Secondary | ICD-10-CM | POA: Diagnosis not present

## 2017-11-01 DIAGNOSIS — Z923 Personal history of irradiation: Secondary | ICD-10-CM | POA: Diagnosis not present

## 2017-11-01 DIAGNOSIS — Z08 Encounter for follow-up examination after completed treatment for malignant neoplasm: Secondary | ICD-10-CM | POA: Diagnosis not present

## 2017-11-01 DIAGNOSIS — Z7901 Long term (current) use of anticoagulants: Secondary | ICD-10-CM | POA: Diagnosis not present

## 2017-11-01 DIAGNOSIS — Z86718 Personal history of other venous thrombosis and embolism: Secondary | ICD-10-CM | POA: Insufficient documentation

## 2017-11-01 DIAGNOSIS — M5137 Other intervertebral disc degeneration, lumbosacral region: Secondary | ICD-10-CM | POA: Insufficient documentation

## 2017-11-01 DIAGNOSIS — C6292 Malignant neoplasm of left testis, unspecified whether descended or undescended: Secondary | ICD-10-CM

## 2017-11-02 ENCOUNTER — Telehealth: Payer: Self-pay | Admitting: *Deleted

## 2017-11-02 NOTE — Progress Notes (Signed)
Radiation Oncology         [phone] ________________________________  Name: Connor Howell MRN: [id number]  Date: 11/01/2017  DOB: [birth year]  Post Treatment Note  CC: Vernie Shanks, MD  [name], MD  Diagnosis:   52 y.o. 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  The paraaortic and ipsiliateral pelvic nodes were treated to 20 Gy in 10 fractions 2.  The involved enlarged lymph node was treated to 36 Gy with 8 additional fractions  Narrative:  Connor Howell is a pleasant 52 y.o. gentleman with a history of Stage IIB pure seminoma who presented with scrotal swelling and a palpable mass of the testicle on the left side. This was present for about 2 months prior to his presentation. A scrotal ultrasound on 03/11/16 revealed a 3.2 x 2.0 x 3.1 cm solid mass of the left testicle with blood flow and micro calcifications concerning for  testicular malignancy, and the right testicle was normal. His AFP on 03/16/17 was 3.8, and beta HCG was <1.  He proceeded to left radical orchiectomy with Dr. Pilar Jarvis on 03/21/16.  Pathology showed a 3.2 cm seminoma with LVI and involvement of the rete testis.  CT abdomen/pelvis on 04/12/16 showed an isolated 1.1 cm left retroperitoneal node suspicious for isolated nodal metastasis. The patient proceeded with repeat CT abdomen/pelvis on  07/14/17 which showed this left retroperitoneal node to measure 2.1 cm. He elected for radiotherapy which was completed at the end of December 2017. He tolerated treatment and has been followed in surveillance. His CT in March 2018 of the chest/abd/pelvis revealed no new disease, and the treated node was noted to resolve completely, and the node was normal in dimension at 4-5 mm. On 03/16/17 the CT Abd/Pelvis was repeated and did not reveal any concerns for recurrent disease. He does have right foraminal impingement at L5-S1 due to degenerative disc  disease. Of note since surgery his AFP has remained 4.2 or less.  His last imaging on 07/13/2017 did not reveal concerns for recurrent or progressive disease.  He comes today for clinical assessment.  He is scheduled to see Laverna Peace, NP in May 2019.  On review of systems, the patient reports that he is doing well overall. He denies any chest pain, shortness of breath, cough, fevers, chills, night sweats, unintended weight changes. He denies any bowel or bladder disturbances, and denies abdominal pain, nausea or vomiting. He denies any new musculoskeletal or joint aches or pains, new skin lesions or concerns. A complete review of systems is obtained and is otherwise negative.   Past Medical History:  Past Medical History:  Diagnosis Date  . Anticoagulated on Coumadin   . Cancer ([place]) 03/21/2016   seminoma left testis/testicular cancer  . History of DVT of lower extremity    first dx 2001  right lower leg w/ recurrency   . Protein S deficiency ([place])   . Testicular mass    LEFT    Past Surgical History: Past Surgical History:  Procedure Laterality Date  . KNEE ARTHROSCOPY W/ ACL RECONSTRUCTION Right 2003 approx  . ORCHIECTOMY Left 03/21/2016   Procedure: LEFT RADICAL ORCHIECTOMY;  Surgeon: [name], MD;  Location: [facility];  Service: Urology;  Laterality: Left;    Social History:  Social History   Socioeconomic History  . Marital status: Married    Spouse name: Not on file  .  Number of children: Not on file  . Years of education: Not on file  . Highest education level: Not on file  Social Needs  . Financial resource strain: Not on file  . Food insecurity - worry: Not on file  . Food insecurity - inability: Not on file  . Transportation needs - medical: Not on file  . Transportation needs - non-medical: Not on file  Occupational History  . Not on file  Tobacco Use  . Smoking status: Never Smoker  . Smokeless tobacco: Never Used  Substance  and Sexual Activity  . Alcohol use: Yes    Alcohol/week: 0.0 oz    Comment: Moderate drinker  . Drug use: No  . Sexual activity: Yes  Other Topics Concern  . Not on file  Social History Narrative  . Not on file  The patient is married and lives in [place]. He has a son who is in high school.  Family History: Family History  Problem Relation Age of Onset  . Cancer Neg Hx     ALLERGIES:  is allergic to epinephrine.  Meds: Current Outpatient Medications  Medication Sig Dispense Refill  . fenofibrate 54 MG tablet Take 54 mg by mouth daily. with food  5  . Multiple Vitamin (MULTIVITAMIN) tablet Take 1 tablet by mouth daily.    Marland Kitchen warfarin (COUMADIN) 5 MG tablet TAKE 1 TABLET BY MOUTH EVERY DAY (Patient taking differently: TAKE 1 TABLET BY MOUTH EVERY DAY-09/19/17-Taking 2.5 mg daily) 30 tablet 3   No current facility-administered medications for this encounter.     Physical Findings:  height is 5\' 10"  (1.778 m) and weight is 192 lb (87.1 kg). His oral temperature is 97.7 F (36.5 C). His blood pressure is 126/75 and his pulse is 62. His respiration is 20 and oxygen saturation is 100%.  Pain Assessment Pain Score: 0-No pain/10 In general this is a well appearing caucasian male in no acute distress. He is alert and oriented x4 and appropriate throughout the examination. HEENT reveals that the patient is normocephalic, atraumatic. EOMs are intact. PERRLA. Skin is intact without any evidence of gross lesions. Cardiovascular exam reveals a regular rate and rhythm, no clicks rubs or murmurs are auscultated. Chest is clear to auscultation bilaterally. Lymphatic assessment is performed and does not reveal any adenopathy in the cervical, supraclavicular, axillary, or inguinal chains. Abdomen has active bowel sounds in all quadrants and is intact. The abdomen is soft, non tender, non distended. Lower extremities assessed. There is trace right lower extremity edema consistent with his known  history of prior DVT. On the left, there is no pretibial pitting edema, deep calf tenderness, cyanosis or clubbing. GU exam reveals normal appearing male genitalia. The glans is intact and the patient is circumcised. No palpable masses are noted in the left scrotum or up into the inguinal ring. No masses are appreciated of the right testicle or scrotum, nor of the inguinal ring.   Lab Findings: Lab Results  Component Value Date   WBC 3.7 (L) 09/19/2017   HGB 14.3 07/11/2017   HCT 39.9 09/19/2017   MCV 90.9 09/19/2017   PLT 206 09/19/2017     Radiographic Findings: No results found.  Impression/Plan: 1. 52 y.o. 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 The patient appears to be doing well clinically and appears to be NED. We will follow up with his imaging which is due in May 2019. I will see him back in about 12 months or sooner as  necessary as he also follows along with medical oncology in [facility].   2. Prostate Cancer screening. We discussed checking PSA levels at age 54 annually with our office, Dr. Antonieta Pert office, or Dr. Carlynn Purl office. I will check with Scherrie Bateman, NP to see if they will start checking his PSA as well for screening purposes with his lab work that's drawn at their office. 3. History of DVT. The patient will follow up with Dr. Marin Olp and Judson Roch and will continue coumadin as previously outlined.     Carola Rhine, PAC

## 2017-11-02 NOTE — Telephone Encounter (Signed)
CALLED PATIENT TO INFORM OF 1 YR. FU WITH ALISON PERKINS ON 11/01/18 @ 8:30 AM , SPOKE WITH PATIENT AND HE IS AWARE OF THIS APPT.

## 2017-11-14 ENCOUNTER — Inpatient Hospital Stay: Payer: 59 | Attending: Family

## 2017-11-14 DIAGNOSIS — I82401 Acute embolism and thrombosis of unspecified deep veins of right lower extremity: Secondary | ICD-10-CM | POA: Insufficient documentation

## 2017-11-27 ENCOUNTER — Other Ambulatory Visit: Payer: Self-pay | Admitting: Hematology & Oncology

## 2017-11-28 ENCOUNTER — Telehealth: Payer: Self-pay | Admitting: *Deleted

## 2017-11-28 ENCOUNTER — Inpatient Hospital Stay: Payer: 59

## 2017-11-28 DIAGNOSIS — C6292 Malignant neoplasm of left testis, unspecified whether descended or undescended: Secondary | ICD-10-CM

## 2017-11-28 DIAGNOSIS — I82401 Acute embolism and thrombosis of unspecified deep veins of right lower extremity: Secondary | ICD-10-CM

## 2017-11-28 DIAGNOSIS — D6859 Other primary thrombophilia: Secondary | ICD-10-CM

## 2017-11-28 LAB — PROTIME-INR
INR: 2.24
PROTHROMBIN TIME: 24.6 s — AB (ref 11.4–15.2)

## 2017-11-28 NOTE — Telephone Encounter (Addendum)
Patient is aware of results.   ----- Message from [name], MD sent at 11/28/2017 11:09 AM EDT ----- Call - INR is perfect!!!No change in coumadin dose!!  Laurey Arrow

## 2017-12-12 ENCOUNTER — Inpatient Hospital Stay: Payer: 59 | Attending: Family

## 2017-12-12 DIAGNOSIS — Z7901 Long term (current) use of anticoagulants: Secondary | ICD-10-CM | POA: Diagnosis not present

## 2017-12-12 DIAGNOSIS — C6292 Malignant neoplasm of left testis, unspecified whether descended or undescended: Secondary | ICD-10-CM | POA: Insufficient documentation

## 2017-12-12 DIAGNOSIS — D6859 Other primary thrombophilia: Secondary | ICD-10-CM | POA: Diagnosis present

## 2017-12-12 DIAGNOSIS — I82401 Acute embolism and thrombosis of unspecified deep veins of right lower extremity: Secondary | ICD-10-CM | POA: Diagnosis present

## 2017-12-12 LAB — PROTIME-INR
INR: 2.06
PROTHROMBIN TIME: 23.1 s — AB (ref 11.4–15.2)

## 2017-12-26 ENCOUNTER — Inpatient Hospital Stay (HOSPITAL_BASED_OUTPATIENT_CLINIC_OR_DEPARTMENT_OTHER): Payer: 59 | Admitting: Family

## 2017-12-26 ENCOUNTER — Encounter: Payer: Self-pay | Admitting: Family

## 2017-12-26 ENCOUNTER — Other Ambulatory Visit: Payer: Self-pay

## 2017-12-26 ENCOUNTER — Inpatient Hospital Stay: Payer: 59

## 2017-12-26 VITALS — BP 133/71 | HR 68 | Temp 98.2°F | Resp 16 | Wt 194.0 lb

## 2017-12-26 DIAGNOSIS — D6859 Other primary thrombophilia: Secondary | ICD-10-CM

## 2017-12-26 DIAGNOSIS — C6292 Malignant neoplasm of left testis, unspecified whether descended or undescended: Secondary | ICD-10-CM

## 2017-12-26 DIAGNOSIS — I82401 Acute embolism and thrombosis of unspecified deep veins of right lower extremity: Secondary | ICD-10-CM | POA: Diagnosis not present

## 2017-12-26 DIAGNOSIS — Z7901 Long term (current) use of anticoagulants: Secondary | ICD-10-CM

## 2017-12-26 LAB — CBC WITH DIFFERENTIAL (CANCER CENTER ONLY)
BASOS PCT: 1 %
Basophils Absolute: 0 10*3/uL (ref 0.0–0.1)
EOS PCT: 2 %
Eosinophils Absolute: 0.1 10*3/uL (ref 0.0–0.5)
HCT: 39.1 % (ref 38.7–49.9)
HEMOGLOBIN: 13.2 g/dL (ref 13.0–17.1)
Lymphocytes Relative: 26 %
Lymphs Abs: 1.1 10*3/uL (ref 0.9–3.3)
MCH: 30.5 pg (ref 28.0–33.4)
MCHC: 33.8 g/dL (ref 32.0–35.9)
MCV: 90.3 fL (ref 82.0–98.0)
Monocytes Absolute: 0.3 10*3/uL (ref 0.1–0.9)
Monocytes Relative: 8 %
NEUTROS PCT: 63 %
Neutro Abs: 2.6 10*3/uL (ref 1.5–6.5)
PLATELETS: 181 10*3/uL (ref 145–400)
RBC: 4.33 MIL/uL (ref 4.20–5.70)
RDW: 12.7 % (ref 11.1–15.7)
WBC: 4.1 10*3/uL (ref 4.0–10.0)

## 2017-12-26 LAB — CMP (CANCER CENTER ONLY)
ALBUMIN: 4.4 g/dL (ref 3.5–5.0)
ALK PHOS: 46 U/L (ref 40–150)
ALT: 25 U/L (ref 0–55)
ANION GAP: 9 (ref 3–11)
AST: 26 U/L (ref 5–34)
BUN: 15 mg/dL (ref 7–26)
CHLORIDE: 107 mmol/L (ref 98–109)
CO2: 25 mmol/L (ref 22–29)
CREATININE: 1.05 mg/dL (ref 0.70–1.30)
Calcium: 9.4 mg/dL (ref 8.4–10.4)
GFR, Estimated: >60 mL/min (ref >60)
GLUCOSE: 78 mg/dL (ref 70–140)
Potassium: 3.8 mmol/L (ref 3.5–5.1)
SODIUM: 141 mmol/L (ref 136–145)
Total Bilirubin: 0.4 mg/dL (ref 0.2–1.2)
Total Protein: 7.4 g/dL (ref 6.4–8.3)

## 2017-12-26 LAB — PROTIME-INR
INR: 2.2
PROTHROMBIN TIME: 24.3 s — AB (ref 11.4–15.2)

## 2017-12-26 LAB — LACTATE DEHYDROGENASE: LDH: 213 U/L (ref 125–245)

## 2017-12-26 NOTE — Progress Notes (Signed)
Hematology and Oncology Follow Up Visit  Connor Howell [id number] [birth year] 52 y.o. 12/26/2017   Principle Diagnosis:  Stage II seminoma of the left testicle Recurrent DVT of the right leg Protein S deficiency  Past Therapy: Left radical orchiectomy Radiation Therapy - completed 08/2016  Current Therapy:   Coumadin to obtain INR between 2-3    Interim History:  Connor Howell is here today for follow-up. He is doing quite well and has no complaints. He is staying busy with work.  AFP in January was 4.1 and Beta 2 microglobulin 1.4. We will repeat a CT scan of the abdomen and pelvis in 2 weeks.  No fever, chills, n/v, cough, rash, dizziness, SOB, chest pain, palpitations, abdominal pain or changes in bowel or bladder habits.  No lymphadenopathy noted on exam.  He is tolerating Coumadin well and has had no issue with bleeding. No bruising or petechiae. INR is theraputic at 2.2.  No swelling, tenderness, numbness or tingling in his extremities. No c/o pain.  He has maintained a good appetite and is staying well hydrated. His weight is stable.   ECOG Performance Status: 1 - Symptomatic but completely ambulatory  Medications:  Allergies as of 12/26/2017      Reactions   Epinephrine Other (See Comments)      Medication List        Accurate as of 12/26/17 10:06 AM. Always use your most recent med list.          fenofibrate 54 MG tablet Take 54 mg by mouth daily. with food   multivitamin tablet Take 1 tablet by mouth daily.   warfarin 5 MG tablet Commonly known as:  COUMADIN TAKE 1 TABLET BY MOUTH EVERY DAY       Allergies:  Allergies  Allergen Reactions  . Epinephrine Other (See Comments)    Past Medical History, Surgical history, Social history, and Family History were reviewed and updated.  Review of Systems: All other 10 point review of systems is negative.   Physical Exam:  vitals were not taken for this visit.   Wt Readings from Last 3 Encounters:    11/01/17 192 lb (87.1 kg)  09/19/17 196 lb (88.9 kg)  06/27/17 193 lb 8 oz (87.8 kg)    Ocular: Sclerae unicteric, pupils equal, round and reactive to light Ear-nose-throat: Oropharynx clear, dentition fair Lymphatic: No cervical, supraclavicular, axillary or inguinal adenopathy Lungs no rales or rhonchi, good excursion bilaterally Heart regular rate and rhythm, no murmur appreciated Abd soft, nontender, positive bowel sounds, no liver or spleen tip palpated on exam, no fluid wave  MSK no focal spinal tenderness, no joint edema Neuro: non-focal, well-oriented, appropriate affect Breasts: Deferred   Lab Results  Component Value Date   WBC 4.1 12/26/2017   HGB 13.2 12/26/2017   HCT 39.1 12/26/2017   MCV 90.3 12/26/2017   PLT 181 12/26/2017   No results found for: FERRITIN, IRON, TIBC, UIBC, IRONPCTSAT Lab Results  Component Value Date   RBC 4.33 12/26/2017   No results found for: KPAFRELGTCHN, LAMBDASER, KAPLAMBRATIO No results found for: IGGSERUM, IGA, IGMSERUM No results found for: Ronnald Ramp, A1GS, A2GS, Tillman Sers, SPEI   Chemistry      Component Value Date/Time   NA 140 09/19/2017 0932   NA 146 (H) 06/27/2017 1024   NA 139 07/14/2016 0910   K 4.9 09/19/2017 0932   K 4.7 06/27/2017 1024   K 4.5 07/14/2016 0910   CL 105 09/19/2017 0932  CL 105 06/27/2017 1024   CO2 27 09/19/2017 0932   CO2 31 06/27/2017 1024   CO2 27 07/14/2016 0910   BUN 19 09/19/2017 0932   BUN 16 06/27/2017 1024   BUN 12.9 07/14/2016 0910   CREATININE 1.10 09/19/2017 0932   CREATININE 1.0 06/27/2017 1024   CREATININE 0.9 07/14/2016 0910      Component Value Date/Time   CALCIUM 9.4 09/19/2017 0932   CALCIUM 9.7 06/27/2017 1024   CALCIUM 9.6 07/14/2016 0910   ALKPHOS 57 09/19/2017 0932   ALKPHOS 68 06/27/2017 1024   ALKPHOS 83 07/14/2016 0910   AST 31 09/19/2017 0932   AST 31 07/14/2016 0910   ALT 31 09/19/2017 0932   ALT 45 06/27/2017 1024   ALT 37  07/14/2016 0910   BILITOT 0.5 09/19/2017 0932   BILITOT 0.61 07/14/2016 0910      Impression and Plan: Connor Howell is a very pleasant 52 yo caucasian gentleman with protein S deficiency with history of recurrent DVT. He is doing well on Coumadin and INR is therapeutic at 2.2.  He also has history of stage II seminoma of the left testicle with radical orchiectomy followed by radiation, completed in December 2017. He has done well and so far there has been no evidence of recurrence. Exam today was negative.  Beta 2 microglobulin and AFP are pending. We will repeat his CT scan in 2 weeks.  We will continue to check his INR every 2 weeks.  We will see him back in another 4 months for follow up.  He will contact our office with any questions or concerns. We can certainly see her sooner if need be.   Laverna Peace, NP 4/23/201910:06 AM

## 2017-12-27 ENCOUNTER — Other Ambulatory Visit: Payer: Self-pay | Admitting: Family

## 2017-12-27 DIAGNOSIS — C6292 Malignant neoplasm of left testis, unspecified whether descended or undescended: Secondary | ICD-10-CM

## 2017-12-27 LAB — AFP TUMOR MARKER: AFP, SERUM, TUMOR MARKER: 3.6 ng/mL (ref 0.0–8.3)

## 2017-12-27 LAB — BETA 2 MICROGLOBULIN, SERUM: Beta-2 Microglobulin: 1.4 mg/L (ref 0.6–2.4)

## 2017-12-28 ENCOUNTER — Telehealth: Payer: Self-pay | Admitting: *Deleted

## 2017-12-28 NOTE — Telephone Encounter (Addendum)
Patient is aware of results  ----- Message from Eliezer Bottom, NP sent at 12/27/2017  3:28 PM EDT ----- Regarding: Tumor markers His AFP and Beta 2 microglobulin are stable :) Thank you!  Sarah  ----- Message ----- From: Buel Ream, Lab In [place] Sent: 12/26/2017   9:54 AM To: Eliezer Bottom, NP

## 2018-01-09 ENCOUNTER — Ambulatory Visit (HOSPITAL_BASED_OUTPATIENT_CLINIC_OR_DEPARTMENT_OTHER)
Admission: RE | Admit: 2018-01-09 | Discharge: 2018-01-09 | Disposition: A | Discharge status: Home / Self Care | Payer: 59 | Source: Ambulatory Visit | Attending: Family | Admitting: Family

## 2018-01-09 ENCOUNTER — Encounter (HOSPITAL_BASED_OUTPATIENT_CLINIC_OR_DEPARTMENT_OTHER): Payer: Self-pay

## 2018-01-09 ENCOUNTER — Inpatient Hospital Stay: Payer: 59 | Attending: Family

## 2018-01-09 DIAGNOSIS — C6292 Malignant neoplasm of left testis, unspecified whether descended or undescended: Secondary | ICD-10-CM | POA: Diagnosis present

## 2018-01-09 DIAGNOSIS — D6859 Other primary thrombophilia: Secondary | ICD-10-CM | POA: Diagnosis not present

## 2018-01-09 DIAGNOSIS — K5730 Diverticulosis of large intestine without perforation or abscess without bleeding: Secondary | ICD-10-CM | POA: Insufficient documentation

## 2018-01-09 DIAGNOSIS — I82401 Acute embolism and thrombosis of unspecified deep veins of right lower extremity: Secondary | ICD-10-CM

## 2018-01-09 DIAGNOSIS — Z7901 Long term (current) use of anticoagulants: Secondary | ICD-10-CM

## 2018-01-09 LAB — PROTIME-INR
INR: 2.03
PROTHROMBIN TIME: 22.8 s — AB (ref 11.4–15.2)

## 2018-01-09 MED ORDER — IOPAMIDOL (ISOVUE-300) INJECTION 61%
100.0000 mL | Freq: Once | INTRAVENOUS | Status: AC | PRN
  Administered 2018-01-09: 100 mL via INTRAVENOUS

## 2018-01-10 ENCOUNTER — Telehealth: Payer: Self-pay | Admitting: Radiation Oncology

## 2018-01-10 ENCOUNTER — Telehealth: Payer: Self-pay | Admitting: Family

## 2018-01-10 NOTE — Telephone Encounter (Signed)
I spoke with [name] and went over his CT of the abdomen and pelvis as well as his chest xray results with him. These were negative and AFP and beta 2 were stable. All questions were answered and we will plan to see him at his next follow-up.

## 2018-01-10 NOTE — Telephone Encounter (Signed)
-----   Message from Hayden Pedro, PA-C sent at 01/10/2018  8:21 AM EDT ----- Do you mind letting this sweet guy know that his scan looks good and I'll see him next February?  ----- Message ----- From: Hayden Pedro, PA-C Sent: 11/08/2017   4:38 PM To: Hayden Pedro, PA-C Subject: 01/12/18                                       Check to see if he had scan

## 2018-01-10 NOTE — Telephone Encounter (Signed)
Phoned patient as requested by Shona Simpson, PA-C. Explained his scan looks goo and Bryson Ha will see him in February 2020. Confirmed 2020 appointment and advised patient to call sooner with any needs. Patient verbalized understanding, relief, and expressed appreciation for the call.

## 2018-01-15 DIAGNOSIS — D6859 Other primary thrombophilia: Secondary | ICD-10-CM | POA: Diagnosis not present

## 2018-01-15 DIAGNOSIS — E785 Hyperlipidemia, unspecified: Secondary | ICD-10-CM | POA: Diagnosis not present

## 2018-01-15 DIAGNOSIS — C6212 Malignant neoplasm of descended left testis: Secondary | ICD-10-CM | POA: Diagnosis not present

## 2018-01-23 ENCOUNTER — Inpatient Hospital Stay: Payer: 59

## 2018-01-23 DIAGNOSIS — I82401 Acute embolism and thrombosis of unspecified deep veins of right lower extremity: Secondary | ICD-10-CM

## 2018-01-23 DIAGNOSIS — Z7901 Long term (current) use of anticoagulants: Secondary | ICD-10-CM

## 2018-01-23 DIAGNOSIS — D6859 Other primary thrombophilia: Secondary | ICD-10-CM | POA: Diagnosis not present

## 2018-01-23 LAB — PROTIME-INR
INR: 2.28
PROTHROMBIN TIME: 25 s — AB (ref 11.4–15.2)

## 2018-02-06 ENCOUNTER — Inpatient Hospital Stay: Payer: 59 | Attending: Family

## 2018-02-06 DIAGNOSIS — D6859 Other primary thrombophilia: Secondary | ICD-10-CM | POA: Diagnosis present

## 2018-02-06 DIAGNOSIS — I82401 Acute embolism and thrombosis of unspecified deep veins of right lower extremity: Secondary | ICD-10-CM

## 2018-02-06 DIAGNOSIS — Z7901 Long term (current) use of anticoagulants: Secondary | ICD-10-CM

## 2018-02-06 LAB — PROTIME-INR
INR: 1.92
Prothrombin Time: 21.8 seconds — ABNORMAL HIGH (ref 11.4–15.2)

## 2018-02-20 ENCOUNTER — Other Ambulatory Visit: Payer: 59

## 2018-04-19 ENCOUNTER — Encounter: Payer: Self-pay | Admitting: Family

## 2018-04-19 ENCOUNTER — Inpatient Hospital Stay: Payer: 59 | Attending: Family

## 2018-04-19 ENCOUNTER — Inpatient Hospital Stay (HOSPITAL_BASED_OUTPATIENT_CLINIC_OR_DEPARTMENT_OTHER): Payer: 59 | Admitting: Family

## 2018-04-19 ENCOUNTER — Other Ambulatory Visit: Payer: Self-pay

## 2018-04-19 VITALS — BP 125/71 | HR 62 | Temp 98.3°F | Resp 18 | Wt 194.0 lb

## 2018-04-19 DIAGNOSIS — Z7901 Long term (current) use of anticoagulants: Secondary | ICD-10-CM

## 2018-04-19 DIAGNOSIS — I82401 Acute embolism and thrombosis of unspecified deep veins of right lower extremity: Secondary | ICD-10-CM | POA: Insufficient documentation

## 2018-04-19 DIAGNOSIS — D6859 Other primary thrombophilia: Secondary | ICD-10-CM

## 2018-04-19 DIAGNOSIS — C6292 Malignant neoplasm of left testis, unspecified whether descended or undescended: Secondary | ICD-10-CM | POA: Diagnosis not present

## 2018-04-19 LAB — CMP (CANCER CENTER ONLY)
ALBUMIN: 4.5 g/dL (ref 3.5–5.0)
ALK PHOS: 42 U/L (ref 38–126)
ALT: 33 U/L (ref 0–44)
AST: 29 U/L (ref 15–41)
Anion gap: 10 (ref 5–15)
BILIRUBIN TOTAL: 0.4 mg/dL (ref 0.3–1.2)
BUN: 14 mg/dL (ref 6–20)
CALCIUM: 9.4 mg/dL (ref 8.9–10.3)
CO2: 27 mmol/L (ref 22–32)
CREATININE: 1.12 mg/dL (ref 0.61–1.24)
Chloride: 105 mmol/L (ref 98–111)
GFR, Estimated: >60 mL/min (ref >60)
GLUCOSE: 96 mg/dL (ref 70–99)
Potassium: 4.5 mmol/L (ref 3.5–5.1)
Sodium: 142 mmol/L (ref 135–145)
TOTAL PROTEIN: 7.8 g/dL (ref 6.5–8.1)

## 2018-04-19 LAB — CBC WITH DIFFERENTIAL (CANCER CENTER ONLY)
BASOS ABS: 0 10*3/uL (ref 0.0–0.1)
BASOS PCT: 1 %
EOS ABS: 0.1 10*3/uL (ref 0.0–0.5)
EOS PCT: 3 %
HCT: 42.2 % (ref 38.7–49.9)
HEMOGLOBIN: 13.9 g/dL (ref 13.0–17.1)
LYMPHS ABS: 0.9 10*3/uL (ref 0.9–3.3)
Lymphocytes Relative: 25 %
MCH: 29.8 pg (ref 28.0–33.4)
MCHC: 32.9 g/dL (ref 32.0–35.9)
MCV: 90.6 fL (ref 82.0–98.0)
Monocytes Absolute: 0.3 10*3/uL (ref 0.1–0.9)
Monocytes Relative: 9 %
Neutro Abs: 2.2 10*3/uL (ref 1.5–6.5)
Neutrophils Relative %: 62 %
PLATELETS: 201 10*3/uL (ref 145–400)
RBC: 4.66 MIL/uL (ref 4.20–5.70)
RDW: 12.9 % (ref 11.1–15.7)
WBC: 3.6 10*3/uL — AB (ref 4.0–10.0)

## 2018-04-19 LAB — LACTATE DEHYDROGENASE: LDH: 206 U/L — ABNORMAL HIGH (ref 98–192)

## 2018-04-19 LAB — PROTIME-INR
INR: 2.44
PROTHROMBIN TIME: 26.3 s — AB (ref 11.4–15.2)

## 2018-04-19 NOTE — Progress Notes (Signed)
Hematology and Oncology Follow Up Visit  Jahvier Aldea [id number] [birth year] 52 y.o. 04/19/2018   Principle Diagnosis:  Stage II seminoma of the left testicle Recurrent DVT of the right leg Protein S deficiency  Past Therapy: Left radical orchiectomy Radiation Therapy - completed 08/2016  Current Therapy:   Observation Coumadin to obtain INR between 2-3   Interim History: Mr. Deniel Mcquiston is here today for follow-up. He is doing well and has no complaints at this time. He has had a nice summer with his family.  He has had no issues with lymphadenopathy. No inguinal pain or swelling.  AFP in April was 3.6 and Bets 2 macroglobulin 1.4.  Chest xray and abdomen/pelvis CT scan in May showed no evidence of recurrent or metastatic disease.  INR is stable at 2.44. He is doing well on Coumadin and has had no episodes of bleeding, no bruising or petechiae.  No fever, chills, n/v, cough, rash, dizziness, SOB, chest pain, palpitations, abdominal pain or changes in bowel or bladder habits.  No swelling, tenderness, numbness or tingling in his extremities. No c/o pain.  He has maintained a good appetite and is staying well hydrated. His weight is stable.  He does exercise regularly.   ECOG Performance Status: 0 - Asymptomatic  Medications:  Allergies as of 04/19/2018      Reactions   Epinephrine Other (See Comments)      Medication List        Accurate as of 04/19/18  8:51 AM. Always use your most recent med list.          fenofibrate 54 MG tablet Take 54 mg by mouth daily. with food   multivitamin tablet Take 1 tablet by mouth daily.   warfarin 5 MG tablet Commonly known as:  COUMADIN TAKE 1 TABLET BY MOUTH EVERY DAY       Allergies:  Allergies  Allergen Reactions  . Epinephrine Other (See Comments)    Past Medical History, Surgical history, Social history, and Family History were reviewed and updated.  Review of Systems: All other 10 point review of systems is  negative.   Physical Exam:  vitals were not taken for this visit.   Wt Readings from Last 3 Encounters:  12/26/17 194 lb (88 kg)  11/01/17 192 lb (87.1 kg)  09/19/17 196 lb (88.9 kg)    Ocular: Sclerae unicteric, pupils equal, round and reactive to light Ear-nose-throat: Oropharynx clear, dentition fair Lymphatic: No cervical, supraclavicular or axillary adenopathy Lungs no rales or rhonchi, good excursion bilaterally Heart regular rate and rhythm, no murmur appreciated Abd soft, nontender, positive bowel sounds, no liver or spleen tip palpated on exam, no fluid wave  MSK no focal spinal tenderness, no joint edema Neuro: non-focal, well-oriented, appropriate affect Breasts: Deferred   Lab Results  Component Value Date   WBC 4.1 12/26/2017   HGB 13.2 12/26/2017   HCT 39.1 12/26/2017   MCV 90.3 12/26/2017   PLT 181 12/26/2017   No results found for: FERRITIN, IRON, TIBC, UIBC, IRONPCTSAT Lab Results  Component Value Date   RBC 4.33 12/26/2017   No results found for: KPAFRELGTCHN, LAMBDASER, KAPLAMBRATIO No results found for: IGGSERUM, IGA, IGMSERUM No results found for: Ronnald Ramp, A1GS, A2GS, Tillman Sers, SPEI   Chemistry      Component Value Date/Time   NA 141 12/26/2017 0940   NA 146 (H) 06/27/2017 1024   NA 139 07/14/2016 0910   K 3.8 12/26/2017 0940   K 4.7 06/27/2017  1024   K 4.5 07/14/2016 0910   CL 107 12/26/2017 0940   CL 105 06/27/2017 1024   CO2 25 12/26/2017 0940   CO2 31 06/27/2017 1024   CO2 27 07/14/2016 0910   BUN 15 12/26/2017 0940   BUN 16 06/27/2017 1024   BUN 12.9 07/14/2016 0910   CREATININE 1.05 12/26/2017 0940   CREATININE 1.0 06/27/2017 1024   CREATININE 0.9 07/14/2016 0910      Component Value Date/Time   CALCIUM 9.4 12/26/2017 0940   CALCIUM 9.7 06/27/2017 1024   CALCIUM 9.6 07/14/2016 0910   ALKPHOS 46 12/26/2017 0940   ALKPHOS 68 06/27/2017 1024   ALKPHOS 83 07/14/2016 0910   AST 26 12/26/2017  0940   AST 31 07/14/2016 0910   ALT 25 12/26/2017 0940   ALT 45 06/27/2017 1024   ALT 37 07/14/2016 0910   BILITOT 0.4 12/26/2017 0940   BILITOT 0.61 07/14/2016 0910      Impression and Plan: [name] is a pleasant 52 yo caucasian gentleman with protein S deficiency with history of recurrent DVT. He continues to do well on Coumadin and has had no other recurrent thrombus. INR is stable at 2.44.  He also has history of stage II seminoma of the left testicle with radical orchiectomy followed by radiation completed in December 2017. So far, there has been no evidence of recurrence. His scans in May were stable.  We will plan to see him back in another 3 months for repeat scans, MD follow-up and lab.  We will also check his INR every 3 weeks.  He will contact our office with any questions or concerns. We can certainly see him sooner if need be.   Laverna Peace, NP 8/15/20198:51 AM

## 2018-04-20 LAB — AFP TUMOR MARKER: AFP, Serum, Tumor Marker: 3.5 ng/mL (ref 0.0–8.3)

## 2018-04-20 LAB — BETA 2 MICROGLOBULIN, SERUM: BETA 2 MICROGLOBULIN: 1.6 mg/L (ref 0.6–2.4)

## 2018-04-30 DIAGNOSIS — E785 Hyperlipidemia, unspecified: Secondary | ICD-10-CM | POA: Diagnosis not present

## 2018-04-30 DIAGNOSIS — R195 Other fecal abnormalities: Secondary | ICD-10-CM | POA: Diagnosis not present

## 2018-04-30 DIAGNOSIS — Z0000 Encounter for general adult medical examination without abnormal findings: Secondary | ICD-10-CM | POA: Diagnosis not present

## 2018-04-30 DIAGNOSIS — Z125 Encounter for screening for malignant neoplasm of prostate: Secondary | ICD-10-CM | POA: Diagnosis not present

## 2018-05-10 ENCOUNTER — Other Ambulatory Visit: Payer: 59

## 2018-05-24 DIAGNOSIS — R195 Other fecal abnormalities: Secondary | ICD-10-CM | POA: Diagnosis not present

## 2018-05-31 ENCOUNTER — Inpatient Hospital Stay: Payer: 59 | Attending: Family

## 2018-05-31 DIAGNOSIS — I82401 Acute embolism and thrombosis of unspecified deep veins of right lower extremity: Secondary | ICD-10-CM | POA: Diagnosis not present

## 2018-05-31 DIAGNOSIS — Z7901 Long term (current) use of anticoagulants: Secondary | ICD-10-CM

## 2018-05-31 DIAGNOSIS — D6859 Other primary thrombophilia: Secondary | ICD-10-CM | POA: Insufficient documentation

## 2018-05-31 LAB — PROTIME-INR
INR: 1.95
PROTHROMBIN TIME: 22.1 s — AB (ref 11.4–15.2)

## 2018-06-13 DIAGNOSIS — D123 Benign neoplasm of transverse colon: Secondary | ICD-10-CM | POA: Diagnosis not present

## 2018-06-13 DIAGNOSIS — R195 Other fecal abnormalities: Secondary | ICD-10-CM | POA: Diagnosis not present

## 2018-06-13 DIAGNOSIS — D125 Benign neoplasm of sigmoid colon: Secondary | ICD-10-CM | POA: Diagnosis not present

## 2018-06-13 DIAGNOSIS — D120 Benign neoplasm of cecum: Secondary | ICD-10-CM | POA: Diagnosis not present

## 2018-06-21 ENCOUNTER — Inpatient Hospital Stay: Payer: 59 | Attending: Family

## 2018-06-21 DIAGNOSIS — D6859 Other primary thrombophilia: Secondary | ICD-10-CM | POA: Insufficient documentation

## 2018-06-21 DIAGNOSIS — I82401 Acute embolism and thrombosis of unspecified deep veins of right lower extremity: Secondary | ICD-10-CM | POA: Diagnosis not present

## 2018-06-21 DIAGNOSIS — Z7901 Long term (current) use of anticoagulants: Secondary | ICD-10-CM

## 2018-06-21 LAB — PROTIME-INR
INR: 1.57
Prothrombin Time: 18.6 seconds — ABNORMAL HIGH (ref 11.4–15.2)

## 2018-07-11 DIAGNOSIS — L03116 Cellulitis of left lower limb: Secondary | ICD-10-CM | POA: Diagnosis not present

## 2018-07-11 DIAGNOSIS — L03115 Cellulitis of right lower limb: Secondary | ICD-10-CM | POA: Diagnosis not present

## 2018-07-11 DIAGNOSIS — Z23 Encounter for immunization: Secondary | ICD-10-CM | POA: Diagnosis not present

## 2018-07-18 ENCOUNTER — Ambulatory Visit (HOSPITAL_BASED_OUTPATIENT_CLINIC_OR_DEPARTMENT_OTHER)
Admission: RE | Admit: 2018-07-18 | Discharge: 2018-07-18 | Disposition: A | Discharge status: Home / Self Care | Payer: 59 | Source: Ambulatory Visit | Attending: Family | Admitting: Family

## 2018-07-18 ENCOUNTER — Encounter (HOSPITAL_BASED_OUTPATIENT_CLINIC_OR_DEPARTMENT_OTHER): Payer: Self-pay

## 2018-07-18 ENCOUNTER — Telehealth: Payer: Self-pay | Admitting: *Deleted

## 2018-07-18 DIAGNOSIS — C6292 Malignant neoplasm of left testis, unspecified whether descended or undescended: Secondary | ICD-10-CM | POA: Insufficient documentation

## 2018-07-18 DIAGNOSIS — Z8547 Personal history of malignant neoplasm of testis: Secondary | ICD-10-CM | POA: Diagnosis not present

## 2018-07-18 DIAGNOSIS — C6290 Malignant neoplasm of unspecified testis, unspecified whether descended or undescended: Secondary | ICD-10-CM | POA: Diagnosis not present

## 2018-07-18 MED ORDER — IOPAMIDOL (ISOVUE-300) INJECTION 61%
100.0000 mL | Freq: Once | INTRAVENOUS | Status: AC | PRN
  Administered 2018-07-18: 100 mL via INTRAVENOUS

## 2018-07-18 NOTE — Telephone Encounter (Addendum)
Patient is aware of results  ----- Message from [name], MD sent at 07/18/2018  9:31 AM EST ----- Call - NO cancer on the CTscan!!  [facility]

## 2018-07-19 ENCOUNTER — Inpatient Hospital Stay: Payer: 59

## 2018-07-19 ENCOUNTER — Telehealth: Payer: Self-pay | Admitting: Hematology & Oncology

## 2018-07-19 ENCOUNTER — Inpatient Hospital Stay: Payer: 59 | Attending: Family | Admitting: Hematology & Oncology

## 2018-07-19 ENCOUNTER — Other Ambulatory Visit: Payer: Self-pay

## 2018-07-19 ENCOUNTER — Encounter: Payer: Self-pay | Admitting: Hematology & Oncology

## 2018-07-19 ENCOUNTER — Ambulatory Visit (HOSPITAL_BASED_OUTPATIENT_CLINIC_OR_DEPARTMENT_OTHER): Payer: 59

## 2018-07-19 ENCOUNTER — Other Ambulatory Visit (HOSPITAL_BASED_OUTPATIENT_CLINIC_OR_DEPARTMENT_OTHER): Payer: 59

## 2018-07-19 VITALS — BP 140/74 | HR 64 | Temp 97.5°F | Resp 18 | Wt 197.8 lb

## 2018-07-19 DIAGNOSIS — D6859 Other primary thrombophilia: Secondary | ICD-10-CM | POA: Diagnosis not present

## 2018-07-19 DIAGNOSIS — I82401 Acute embolism and thrombosis of unspecified deep veins of right lower extremity: Secondary | ICD-10-CM | POA: Diagnosis not present

## 2018-07-19 DIAGNOSIS — Z7901 Long term (current) use of anticoagulants: Secondary | ICD-10-CM | POA: Insufficient documentation

## 2018-07-19 DIAGNOSIS — C6292 Malignant neoplasm of left testis, unspecified whether descended or undescended: Secondary | ICD-10-CM

## 2018-07-19 LAB — PROTIME-INR
INR: 2.3
Prothrombin Time: 25 seconds — ABNORMAL HIGH (ref 11.4–15.2)

## 2018-07-19 LAB — CMP (CANCER CENTER ONLY)
ALBUMIN: 4.2 g/dL (ref 3.5–5.0)
ALT: 25 U/L (ref 0–44)
AST: 26 U/L (ref 15–41)
Alkaline Phosphatase: 48 U/L (ref 38–126)
Anion gap: 11 (ref 5–15)
BUN: 16 mg/dL (ref 6–20)
CHLORIDE: 106 mmol/L (ref 98–111)
CO2: 27 mmol/L (ref 22–32)
CREATININE: 1.17 mg/dL (ref 0.61–1.24)
Calcium: 9.5 mg/dL (ref 8.9–10.3)
GFR, Est AFR Am: >60 mL/min (ref >60)
GLUCOSE: 87 mg/dL (ref 70–99)
POTASSIUM: 4.5 mmol/L (ref 3.5–5.1)
SODIUM: 144 mmol/L (ref 135–145)
Total Bilirubin: <0.2 mg/dL — ABNORMAL LOW (ref 0.3–1.2)
Total Protein: 7.9 g/dL (ref 6.5–8.1)

## 2018-07-19 LAB — CBC WITH DIFFERENTIAL (CANCER CENTER ONLY)
ABS IMMATURE GRANULOCYTES: 0.01 10*3/uL (ref 0.00–0.07)
BASOS ABS: 0 10*3/uL (ref 0.0–0.1)
BASOS PCT: 1 %
Eosinophils Absolute: 0.1 10*3/uL (ref 0.0–0.5)
Eosinophils Relative: 2 %
HCT: 41.1 % (ref 39.0–52.0)
Hemoglobin: 13.2 g/dL (ref 13.0–17.0)
IMMATURE GRANULOCYTES: 0 %
LYMPHS ABS: 1.1 10*3/uL (ref 0.7–4.0)
Lymphocytes Relative: 27 %
MCH: 29.6 pg (ref 26.0–34.0)
MCHC: 32.1 g/dL (ref 30.0–36.0)
MCV: 92.2 fL (ref 80.0–100.0)
MONOS PCT: 8 %
Monocytes Absolute: 0.3 10*3/uL (ref 0.1–1.0)
NEUTROS ABS: 2.4 10*3/uL (ref 1.7–7.7)
NEUTROS PCT: 62 %
NRBC: 0 % (ref 0.0–0.2)
Platelet Count: 259 10*3/uL (ref 150–400)
RBC: 4.46 MIL/uL (ref 4.22–5.81)
RDW: 12.2 % (ref 11.5–15.5)
WBC Count: 3.9 10*3/uL — ABNORMAL LOW (ref 4.0–10.5)

## 2018-07-19 LAB — LACTATE DEHYDROGENASE: LDH: 210 U/L — AB (ref 98–192)

## 2018-07-19 NOTE — Progress Notes (Signed)
Hematology and Oncology Follow Up Visit  Connor Howell [id number] Nov 21, 1965 52 y.o. 07/19/2018   Principle Diagnosis:  Stage II seminoma of the left testicle Recurrent DVT of the right leg Protein S deficiency  Past Therapy: Left radical orchiectomy Radiation Therapy - completed 08/2016  Current Therapy:   Observation Coumadin to obtain INR between 2-3   Interim History: [name] is here today for follow-up.  He is doing quite well.  He is still working full-time.  He has had no problems with work.  He had his last set of CT scans a couple weeks ago.  Thankfully, the CT scan did not show any evidence of recurrent testicular cancer.  He had a chest x-ray which also showed no evidence of recurrent testicular cancer.  There is been no abdominal pain.  He has had no change in bowel or bladder habits.  He has had no leg swelling.  He is doing well on Coumadin.  He has his INRs checked every couple months.  He has had no issues with cough or shortness of breath.  He has had no rashes.  He will be with his family for Thanksgiving and Christmas.  Overall, his performance status is ECOG 0.      Medications:  Allergies as of 07/19/2018      Reactions   Epinephrine Other (See Comments)      Medication List        Accurate as of 07/19/18 11:05 AM. Always use your most recent med list.          fenofibrate 54 MG tablet Take 54 mg by mouth daily. with food   multivitamin tablet Take 1 tablet by mouth daily.   warfarin 5 MG tablet Commonly known as:  COUMADIN TAKE 1 TABLET BY MOUTH EVERY DAY       Allergies:  Allergies  Allergen Reactions  . Epinephrine Other (See Comments)    Past Medical History, Surgical history, Social history, and Family History were reviewed and updated.  Review of Systems: Review of Systems  Constitutional: Negative.   HENT: Negative.   Eyes: Negative.   Respiratory: Negative.   Cardiovascular: Negative.     Gastrointestinal: Negative.   Genitourinary: Negative.   Musculoskeletal: Negative.   Skin: Negative.   Neurological: Negative.   Endo/Heme/Allergies: Negative.   Psychiatric/Behavioral: Negative.       Physical Exam:  weight is 197 lb 12.8 oz (89.7 kg). His oral temperature is 97.5 F (36.4 C) (abnormal). His blood pressure is 140/74 and his pulse is 64. His respiration is 18 and oxygen saturation is 100%.   Wt Readings from Last 3 Encounters:  07/19/18 197 lb 12.8 oz (89.7 kg)  04/19/18 194 lb (88 kg)  12/26/17 194 lb (88 kg)    Physical Exam  Constitutional: He is oriented to person, place, and time.  HENT:  Head: Normocephalic and atraumatic.  Mouth/Throat: Oropharynx is clear and moist.  Eyes: Pupils are equal, round, and reactive to light. EOM are normal.  Neck: Normal range of motion.  Cardiovascular: Normal rate, regular rhythm and normal heart sounds.  Pulmonary/Chest: Effort normal and breath sounds normal.  Abdominal: Soft. Bowel sounds are normal.  Abdominal exam shows well-healed left inguinal orchiectomy scar.  There is no adenopathy bilaterally in the groin area.  There is no hepatomegaly.  He has no fluid wave.  Musculoskeletal: Normal range of motion. He exhibits no edema, tenderness or deformity.  Lymphadenopathy:    He has no cervical adenopathy.  Neurological: He is alert and oriented to person, place, and time.  Skin: Skin is warm and dry. No rash noted. No erythema.  Psychiatric: He has a normal mood and affect. His behavior is normal. Judgment and thought content normal.  Vitals reviewed.    Lab Results  Component Value Date   WBC 3.9 (L) 07/19/2018   HGB 13.2 07/19/2018   HCT 41.1 07/19/2018   MCV 92.2 07/19/2018   PLT 259 07/19/2018   No results found for: FERRITIN, IRON, TIBC, UIBC, IRONPCTSAT Lab Results  Component Value Date   RBC 4.46 07/19/2018   No results found for: KPAFRELGTCHN, LAMBDASER, KAPLAMBRATIO No results found for:  Kandis Cocking, IGMSERUM No results found for: Odetta Pink, SPEI   Chemistry      Component Value Date/Time   NA 142 04/19/2018 0837   NA 146 (H) 06/27/2017 1024   NA 139 07/14/2016 0910   K 4.5 04/19/2018 0837   K 4.7 06/27/2017 1024   K 4.5 07/14/2016 0910   CL 105 04/19/2018 0837   CL 105 06/27/2017 1024   CO2 27 04/19/2018 0837   CO2 31 06/27/2017 1024   CO2 27 07/14/2016 0910   BUN 14 04/19/2018 0837   BUN 16 06/27/2017 1024   BUN 12.9 07/14/2016 0910   CREATININE 1.12 04/19/2018 0837   CREATININE 1.0 06/27/2017 1024   CREATININE 0.9 07/14/2016 0910      Component Value Date/Time   CALCIUM 9.4 04/19/2018 0837   CALCIUM 9.7 06/27/2017 1024   CALCIUM 9.6 07/14/2016 0910   ALKPHOS 42 04/19/2018 0837   ALKPHOS 68 06/27/2017 1024   ALKPHOS 83 07/14/2016 0910   AST 29 04/19/2018 0837   AST 31 07/14/2016 0910   ALT 33 04/19/2018 0837   ALT 45 06/27/2017 1024   ALT 37 07/14/2016 0910   BILITOT 0.4 04/19/2018 0837   BILITOT 0.61 07/14/2016 0910      Impression and Plan: [name] is a pleasant 52 yo caucasian gentleman with a history of a stage II seminoma of the left testicle.  He underwent left inguinal orchiectomy.  He then underwent adjuvant radiation therapy.  He had radiation completed in December 2017.  He also has recurrent thromboembolic disease.  He has Protein S deficiency.  He is on Coumadin and doing well with Coumadin.  His INR today is 2.3.  We will now move his appointments out to every 6 months.  We will get a CT of the abdomen and pelvis in 6 months along with a chest x-ray.  His INRs are checked every 2 months.    [name], MD 11/14/201911:05 AM

## 2018-07-19 NOTE — Telephone Encounter (Signed)
Appointments scheduled letter/calendar mailed per 11/14 los °

## 2018-07-20 LAB — BETA 2 MICROGLOBULIN, SERUM: Beta-2 Microglobulin: 1.3 mg/L (ref 0.6–2.4)

## 2018-07-20 LAB — AFP TUMOR MARKER: AFP, Serum, Tumor Marker: 3.4 ng/mL (ref 0.0–8.3)

## 2018-09-08 IMAGING — CT CT ABD-PELV W/ CM
2 of 8 series · 13 of 46 positions shown, 18 images · IV contrast (APPLIED)
Comparison: CT the abdomen and pelvis 04/12/2016.

CLINICAL DATA: 50-year-old male with history of left-sided
testicular cancer status post left orchiectomy. Currently
asymptomatic. Followup study.

EXAM:
CT ABDOMEN AND PELVIS WITH CONTRAST
TECHNIQUE: Multidetector CT imaging of the abdomen and pelvis was performed
using the standard protocol following bolus administration of
intravenous contrast.
CONTRAST:  100mL ZG0PV3-4SS IOPAMIDOL (ZG0PV3-4SS) INJECTION 61%

[Series 2: axial st · axial · 0.75mm/px · z∈[-543,-158]mm · 10 of 89 slices shown, 15 images]
[im 6/89  soft-tissue]
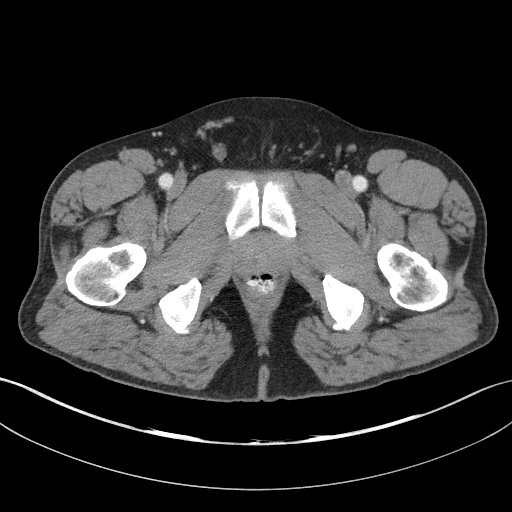
[im 6/89  bone]
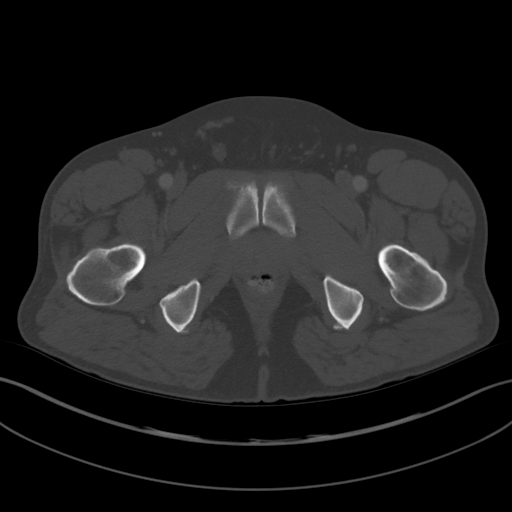
[im 18/89  soft-tissue]
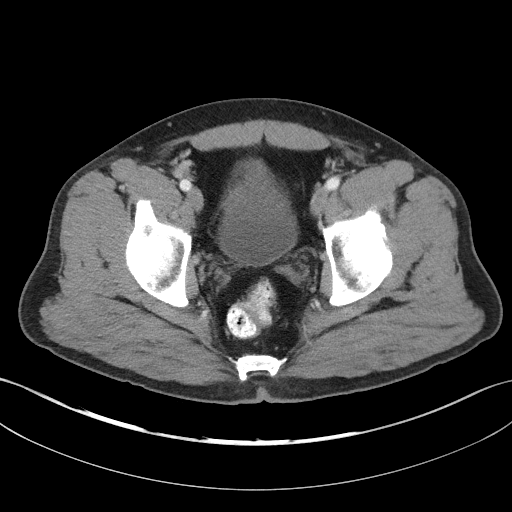
[im 24/89  soft-tissue]
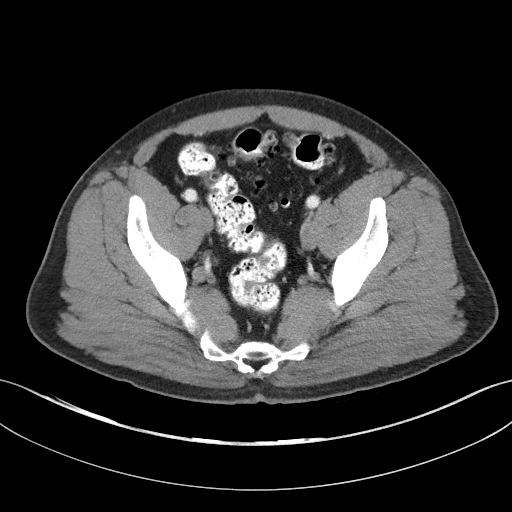
[im 36/89  soft-tissue]
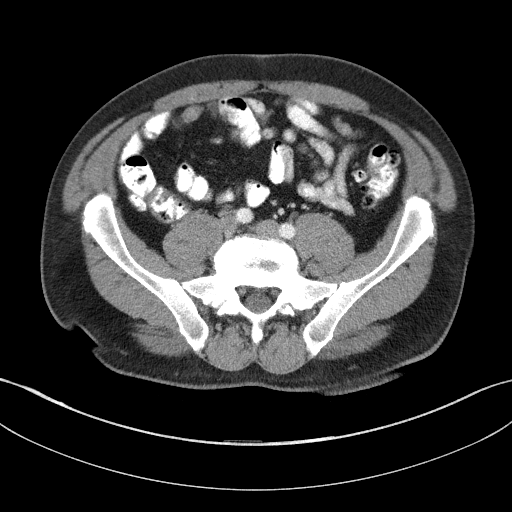
[im 47/89  soft-tissue]
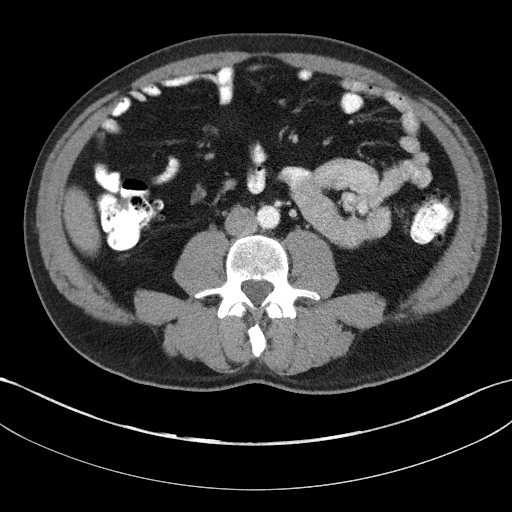
[im 53/89  soft-tissue]
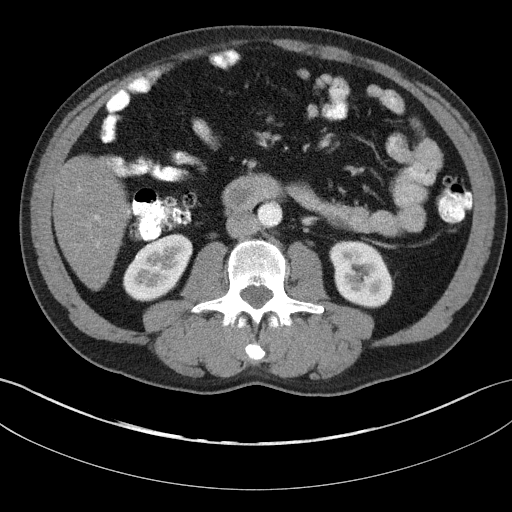
[im 65/89  soft-tissue]
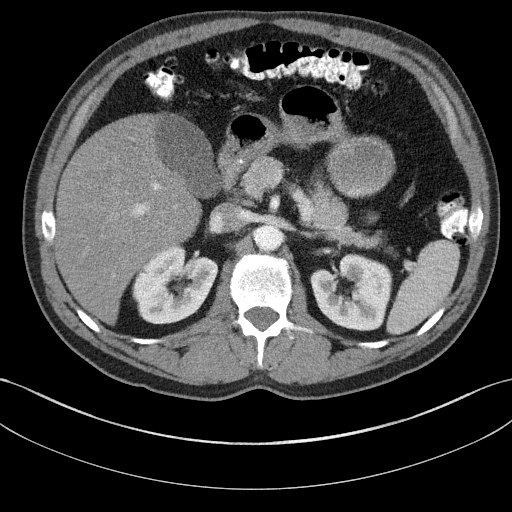
[im 65/89  lung]
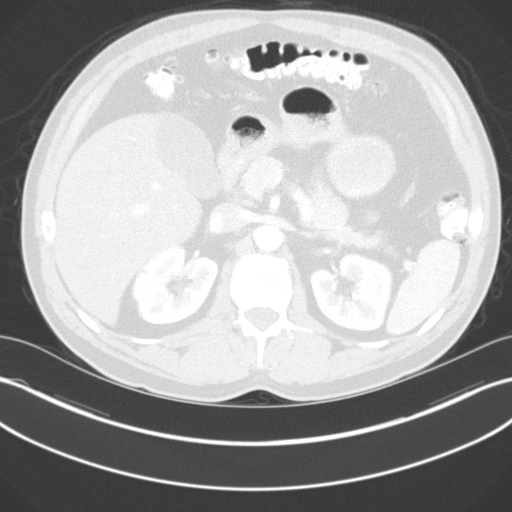
[im 71/89  soft-tissue]
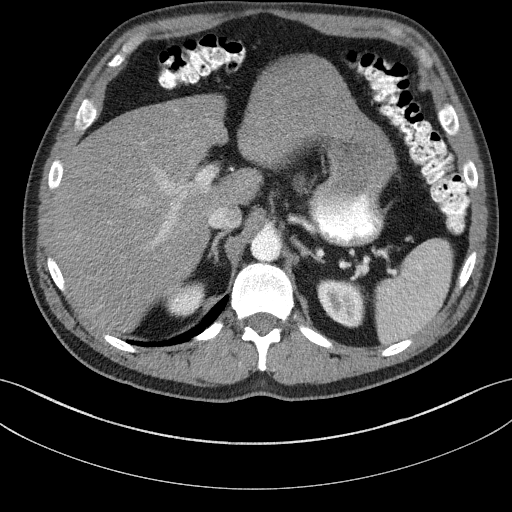
[im 71/89  lung]
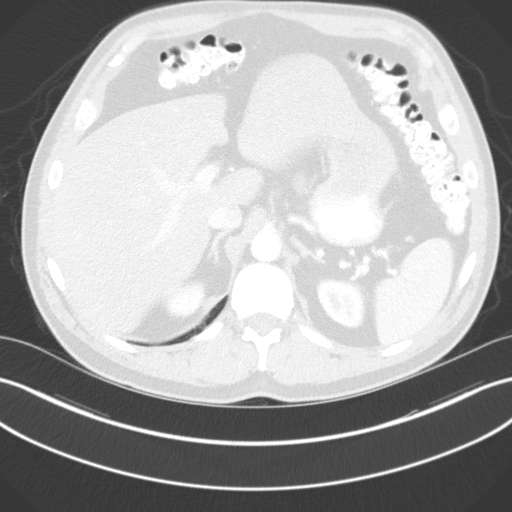
[im 77/89  lung]
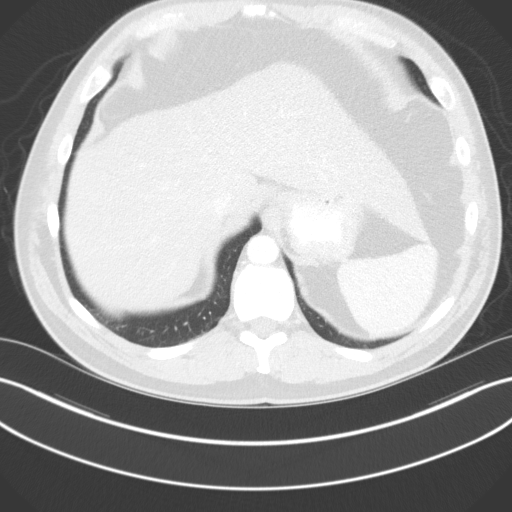
[im 83/89  soft-tissue]
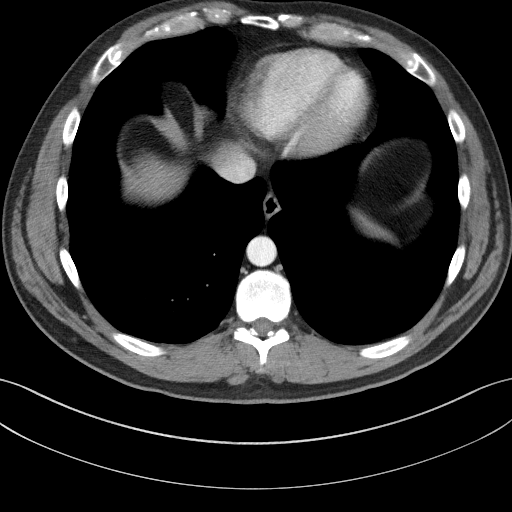
[im 83/89  lung]
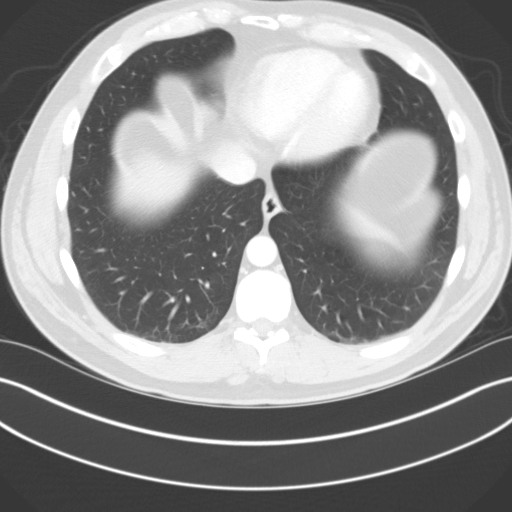
[im 83/89  bone]
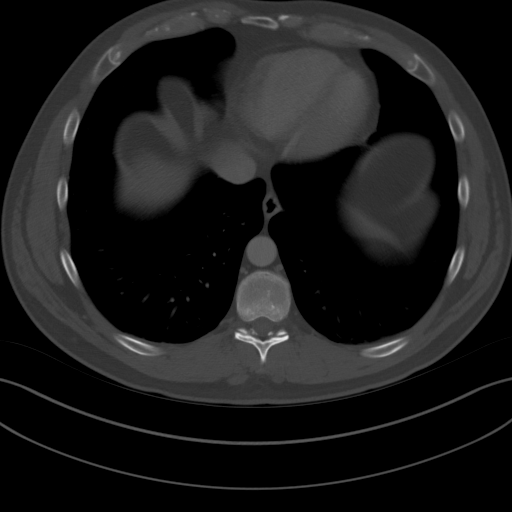

[Series 4: coronal st · coronal · 0.83mm/px · 3 of 120 slices shown]
[im 24/120  soft-tissue]
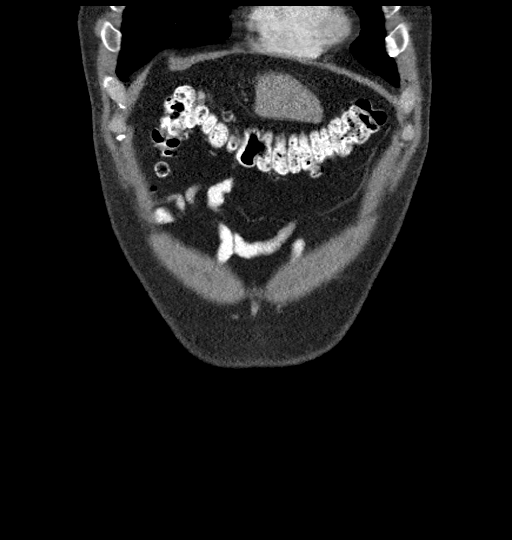
[im 48/120  soft-tissue]
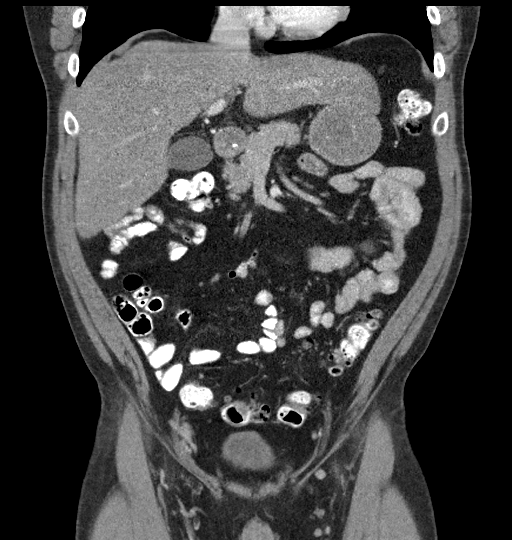
[im 72/120  soft-tissue]
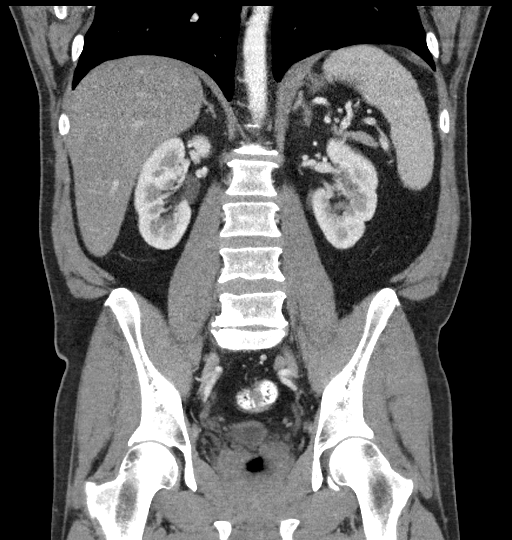

[13 of 46 positions shown; findings below may reference images not displayed]

FINDINGS: Lower chest: Unremarkable.

Hepatobiliary: Diffuse low attenuation throughout the hepatic
parenchyma, compatible with a background of hepatic steatosis. No
cystic or solid hepatic lesions. No intra or extrahepatic biliary
ductal dilatation. Gallbladder is normal in appearance.

Pancreas: No pancreatic mass. No pancreatic ductal dilatation. No
pancreatic or peripancreatic fluid or inflammatory changes.

Spleen: Unremarkable.

Adrenals/Urinary Tract: Bilateral kidneys and bilateral adrenal
glands are normal in appearance. There is no hydroureteronephrosis.
Urinary bladder is normal in appearance.

Stomach/Bowel: Normal appearance of the stomach. No pathologic
dilatation of small bowel or colon. Numerous colonic diverticulae
are noted, without surrounding inflammatory changes to suggest an
acute diverticulitis at this time. Normal appendix.

Vascular/Lymphatic: No significant atherosclerotic disease, aneurysm
or dissection identified in the abdominal or pelvic vasculature.
Previously noted left para-aortic lymphadenopathy has significantly
increased in size, with the largest left para-aortic lymph node
currently measuring 21 mm in short axis (image 33 of series 2)
increased from 11 mm in short axis on prior study 04/12/2016. No
other lymphadenopathy is noted in the abdomen or pelvis.

Reproductive: Postoperative changes of left orchiectomy are noted.
Prostate gland and seminal vesicles are unremarkable in appearance.

Other: No significant volume of ascites.  No pneumoperitoneum.

Musculoskeletal: There are no aggressive appearing lytic or blastic
lesions noted in the visualized portions of the skeleton.
IMPRESSION: 1. Status post left orchiectomy. Previously noted left para-aortic
lymphadenopathy has increased compared to the prior study and is
highly concerning for nodal metastasis. No extra nodal metastatic
disease otherwise noted in the abdomen or pelvis.
2. Hepatic steatosis.
3. Colonic diverticulosis without evidence of acute diverticulitis
at this time.
These results will be called to the ordering clinician or
representative by the Radiologist Assistant, and communication
documented in the PACS or zVision Dashboard.

## 2018-09-18 ENCOUNTER — Inpatient Hospital Stay: Payer: 59 | Attending: Family

## 2018-09-18 DIAGNOSIS — D6859 Other primary thrombophilia: Secondary | ICD-10-CM | POA: Diagnosis not present

## 2018-09-18 DIAGNOSIS — I82401 Acute embolism and thrombosis of unspecified deep veins of right lower extremity: Secondary | ICD-10-CM | POA: Insufficient documentation

## 2018-09-18 DIAGNOSIS — C6292 Malignant neoplasm of left testis, unspecified whether descended or undescended: Secondary | ICD-10-CM

## 2018-09-18 LAB — PROTIME-INR
INR: 2.1
PROTHROMBIN TIME: 23.3 s — AB (ref 11.4–15.2)

## 2018-09-19 ENCOUNTER — Telehealth: Payer: Self-pay | Admitting: *Deleted

## 2018-09-19 NOTE — Telephone Encounter (Addendum)
Patient is aware of results and instructions   ----- Message from [name], MD sent at 09/18/2018  8:16 PM EST ----- Call - the INR is perfect.  No change in coumadin dose.  pete

## 2018-10-31 ENCOUNTER — Telehealth: Payer: Self-pay | Admitting: *Deleted

## 2018-10-31 NOTE — Telephone Encounter (Signed)
Patient called to cancel appointment. He is unable to come due to an important matter. He said he will callback to reschedule.

## 2018-11-01 ENCOUNTER — Ambulatory Visit: Payer: Self-pay | Admitting: Radiation Oncology

## 2018-11-02 ENCOUNTER — Ambulatory Visit: Payer: Self-pay | Admitting: Urology

## 2018-11-15 ENCOUNTER — Other Ambulatory Visit: Payer: 59

## 2018-11-21 ENCOUNTER — Other Ambulatory Visit: Payer: Self-pay

## 2018-11-21 ENCOUNTER — Inpatient Hospital Stay: Payer: 59 | Attending: Family

## 2018-11-21 DIAGNOSIS — I82401 Acute embolism and thrombosis of unspecified deep veins of right lower extremity: Secondary | ICD-10-CM | POA: Diagnosis not present

## 2018-11-21 DIAGNOSIS — D6859 Other primary thrombophilia: Secondary | ICD-10-CM | POA: Diagnosis not present

## 2018-11-21 DIAGNOSIS — Z7901 Long term (current) use of anticoagulants: Secondary | ICD-10-CM

## 2018-11-21 LAB — PROTIME-INR
INR: 2.1 — ABNORMAL HIGH (ref 0.8–1.2)
PROTHROMBIN TIME: 23.6 s — AB (ref 11.4–15.2)

## 2018-11-27 ENCOUNTER — Other Ambulatory Visit: Payer: Self-pay | Admitting: Hematology & Oncology

## 2018-12-20 ENCOUNTER — Telehealth: Payer: Self-pay | Admitting: *Deleted

## 2018-12-20 NOTE — Telephone Encounter (Signed)
Call received from patient stating that he has had two missed calls from this office today and also that he has received a message from his insurance regarding denial of iron infusion.  No orders seen for iron infusion.  Pt informed that I do not see where anyone from this office has contacted him.  Patient appreciative of assistance.  Patient informed that no orders for iron are seen in patient's chart.

## 2019-01-10 ENCOUNTER — Inpatient Hospital Stay: Payer: 59

## 2019-01-10 ENCOUNTER — Ambulatory Visit (HOSPITAL_BASED_OUTPATIENT_CLINIC_OR_DEPARTMENT_OTHER): Admission: RE | Admit: 2019-01-10 | Payer: 59 | Source: Ambulatory Visit

## 2019-01-17 ENCOUNTER — Ambulatory Visit: Payer: 59 | Admitting: Hematology & Oncology

## 2019-04-25 ENCOUNTER — Other Ambulatory Visit: Payer: Self-pay | Admitting: Hematology & Oncology

## 2019-07-20 ENCOUNTER — Other Ambulatory Visit: Payer: Self-pay | Admitting: Hematology & Oncology

## 2019-11-10 ENCOUNTER — Ambulatory Visit: Payer: 59 | Attending: Internal Medicine

## 2019-11-10 DIAGNOSIS — Z23 Encounter for immunization: Secondary | ICD-10-CM | POA: Insufficient documentation

## 2019-11-10 NOTE — Progress Notes (Signed)
   Covid-19 Vaccination Clinic  Name:  Connor Howell    MRN: [id number] DOB: Aug 04, 1966  11/10/2019  [name] was observed post Covid-19 immunization for 15 minutes without incident. He was provided with Vaccine Information Sheet and instruction to access the V-Safe system.   [name] was instructed to call 911 with any severe reactions post vaccine: Marland Kitchen Difficulty breathing  . Swelling of face and throat  . A fast heartbeat  . A bad rash all over body  . Dizziness and weakness   Immunizations Administered    Name Date Dose VIS Date Route   Pfizer COVID-19 Vaccine 11/10/2019  4:28 PM 0.3 mL 08/16/2019 Intramuscular   Manufacturer: [place]   Lot: [id number]   [place]: [id number]

## 2019-12-11 ENCOUNTER — Ambulatory Visit: Payer: 59 | Attending: Internal Medicine

## 2019-12-11 DIAGNOSIS — Z23 Encounter for immunization: Secondary | ICD-10-CM

## 2019-12-11 NOTE — Progress Notes (Signed)
   Covid-19 Vaccination Clinic  Name:  Connor Howell    MRN: [id number] DOB: 05-Sep-1966  12/11/2019  [name] was observed post Covid-19 immunization for 15 minutes without incident. He was provided with Vaccine Information Sheet and instruction to access the V-Safe system.   [name] was instructed to call 911 with any severe reactions post vaccine: Marland Kitchen Difficulty breathing  . Swelling of face and throat  . A fast heartbeat  . A bad rash all over body  . Dizziness and weakness   Immunizations Administered    Name Date Dose VIS Date Route   Pfizer COVID-19 Vaccine 12/11/2019  9:51 AM 0.3 mL 08/16/2019 Intramuscular   Manufacturer: [employer]   Lot: [id number]   [place]: [id number]

## 2019-12-18 ENCOUNTER — Inpatient Hospital Stay: Payer: 59

## 2019-12-18 ENCOUNTER — Inpatient Hospital Stay: Payer: 59 | Admitting: Hematology & Oncology

## 2019-12-23 ENCOUNTER — Other Ambulatory Visit: Payer: Self-pay | Admitting: Family

## 2019-12-23 DIAGNOSIS — Z7901 Long term (current) use of anticoagulants: Secondary | ICD-10-CM

## 2019-12-23 DIAGNOSIS — I82401 Acute embolism and thrombosis of unspecified deep veins of right lower extremity: Secondary | ICD-10-CM

## 2019-12-23 DIAGNOSIS — D6859 Other primary thrombophilia: Secondary | ICD-10-CM

## 2019-12-23 DIAGNOSIS — C6292 Malignant neoplasm of left testis, unspecified whether descended or undescended: Secondary | ICD-10-CM

## 2019-12-24 ENCOUNTER — Encounter: Payer: Self-pay | Admitting: Family

## 2019-12-24 ENCOUNTER — Inpatient Hospital Stay: Payer: 59 | Attending: Hematology & Oncology

## 2019-12-24 ENCOUNTER — Inpatient Hospital Stay (HOSPITAL_BASED_OUTPATIENT_CLINIC_OR_DEPARTMENT_OTHER): Payer: 59 | Admitting: Family

## 2019-12-24 ENCOUNTER — Other Ambulatory Visit: Payer: Self-pay

## 2019-12-24 VITALS — BP 124/75 | HR 60 | Temp 97.1°F | Resp 18 | Ht 70.0 in | Wt 187.1 lb

## 2019-12-24 DIAGNOSIS — C6292 Malignant neoplasm of left testis, unspecified whether descended or undescended: Secondary | ICD-10-CM

## 2019-12-24 DIAGNOSIS — D6859 Other primary thrombophilia: Secondary | ICD-10-CM

## 2019-12-24 DIAGNOSIS — Z86718 Personal history of other venous thrombosis and embolism: Secondary | ICD-10-CM | POA: Diagnosis not present

## 2019-12-24 DIAGNOSIS — Z923 Personal history of irradiation: Secondary | ICD-10-CM | POA: Insufficient documentation

## 2019-12-24 DIAGNOSIS — R1909 Other intra-abdominal and pelvic swelling, mass and lump: Secondary | ICD-10-CM | POA: Insufficient documentation

## 2019-12-24 DIAGNOSIS — I82401 Acute embolism and thrombosis of unspecified deep veins of right lower extremity: Secondary | ICD-10-CM

## 2019-12-24 DIAGNOSIS — Z7901 Long term (current) use of anticoagulants: Secondary | ICD-10-CM | POA: Diagnosis not present

## 2019-12-24 LAB — CMP (CANCER CENTER ONLY)
ALT: 25 U/L (ref 0–44)
AST: 24 U/L (ref 15–41)
Albumin: 4.9 g/dL (ref 3.5–5.0)
Alkaline Phosphatase: 40 U/L (ref 38–126)
Anion gap: 9 (ref 5–15)
BUN: 19 mg/dL (ref 6–20)
CO2: 28 mmol/L (ref 22–32)
Calcium: 10 mg/dL (ref 8.9–10.3)
Chloride: 106 mmol/L (ref 98–111)
Creatinine: 1.05 mg/dL (ref 0.61–1.24)
GFR, Est AFR Am: >60 mL/min (ref >60)
GFR, Estimated: >60 mL/min (ref >60)
Glucose, Bld: 108 mg/dL — ABNORMAL HIGH (ref 70–99)
Potassium: 4.7 mmol/L (ref 3.5–5.1)
Sodium: 143 mmol/L (ref 135–145)
Total Bilirubin: 0.4 mg/dL (ref 0.3–1.2)
Total Protein: 7.8 g/dL (ref 6.5–8.1)

## 2019-12-24 LAB — CBC WITH DIFFERENTIAL (CANCER CENTER ONLY)
Abs Immature Granulocytes: 0.01 10*3/uL (ref 0.00–0.07)
Basophils Absolute: 0 10*3/uL (ref 0.0–0.1)
Basophils Relative: 1 %
Eosinophils Absolute: 0.1 10*3/uL (ref 0.0–0.5)
Eosinophils Relative: 2 %
HCT: 40 % (ref 39.0–52.0)
Hemoglobin: 13 g/dL (ref 13.0–17.0)
Immature Granulocytes: 0 %
Lymphocytes Relative: 26 %
Lymphs Abs: 1.1 10*3/uL (ref 0.7–4.0)
MCH: 29.2 pg (ref 26.0–34.0)
MCHC: 32.5 g/dL (ref 30.0–36.0)
MCV: 89.9 fL (ref 80.0–100.0)
Monocytes Absolute: 0.3 10*3/uL (ref 0.1–1.0)
Monocytes Relative: 7 %
Neutro Abs: 2.9 10*3/uL (ref 1.7–7.7)
Neutrophils Relative %: 64 %
Platelet Count: 218 10*3/uL (ref 150–400)
RBC: 4.45 MIL/uL (ref 4.22–5.81)
RDW: 12.6 % (ref 11.5–15.5)
WBC Count: 4.4 10*3/uL (ref 4.0–10.5)
nRBC: 0 % (ref 0.0–0.2)

## 2019-12-24 LAB — PROTIME-INR
INR: 1.6 — ABNORMAL HIGH (ref 0.8–1.2)
Prothrombin Time: 19.3 seconds — ABNORMAL HIGH (ref 11.4–15.2)

## 2019-12-24 NOTE — Progress Notes (Signed)
Hematology and Oncology Follow Up Visit  Connor Howell [id number] 1966/03/10 54 y.o. 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 Connor Howell is doing well but has noted occasional "fullness" in the inguinal area on both sides. No adenopathy noted on today's exam.  No c/o pain or tenderness.  Connor Howell does regular self exams at home.  His last scans were in 2019. Connor Howell has held off on follow-up due to the pandemic.  No fever, chills, n/v, cough, rash, dizziness, SOB, chest pain, palpitations or changes in bowel or bladder habits.  Connor Howell has diverticula and has to be careful with certain foods to prevent flares and abdominal discomfort.   No urinary frequency, urgency or discomfort.  Connor Howell is doing well on Coumadin 2.5 mg PO daily. No episodes of bleeding. No bruising or petechiae.  Connor Howell has intermittent puffiness in the right lower extremity with history of DVT. Connor Howell wears a compression stocking daily for added support which Connor Howell feels is helpful.  No numbness or tingling in his extremities.  No falls or syncopal episodes to report.  Connor Howell has maintained a good appetite and feels Connor Howell is hydrating well daily. His weight is stable.  Connor Howell has had both Covid vaccine injections.   ECOG Performance Status: 0 - Asymptomatic  Medications:  Allergies as of 12/24/2019      Reactions   Epinephrine Other (See Comments)      Medication List       Accurate as of December 24, 2019  8:48 AM. If you have any questions, ask your nurse or doctor.        fenofibrate 54 MG tablet Take 54 mg by mouth daily. with food   multivitamin tablet Take 1 tablet by mouth daily.   warfarin 5 MG tablet Commonly known as: COUMADIN TAKE 1  TABLET BY MOUTH EVERY DAY       Allergies:  Allergies  Allergen Reactions  . Epinephrine Other (See Comments)    Past Medical History, Surgical history, Social history, and Family History were reviewed and updated.  Review of Systems: All other 10 point review of systems is negative.   Physical Exam:  vitals were not taken for this visit.   Wt Readings from Last 3 Encounters:  07/19/18 197 lb 12.8 oz (89.7 kg)  04/19/18 194 lb (88 kg)  12/26/17 194 lb (88 kg)    Ocular: Sclerae unicteric, pupils equal, round and reactive to light Ear-nose-throat: Oropharynx clear, dentition fair Lymphatic: No cervical, supraclavicular axillary or inguinal adenopathy Lungs no rales or rhonchi, good excursion bilaterally Heart regular rate and rhythm, no murmur appreciated Abd soft, nontender, positive bowel sounds, no liver or spleen tip palpated on exam, no fluid wave  MSK no focal spinal tenderness, no joint edema Neuro: non-focal, well-oriented, appropriate affect Breasts: Deferred   Lab Results  Component Value Date   WBC 3.9 (L) 07/19/2018   HGB 13.2 07/19/2018   HCT 41.1 07/19/2018   MCV 92.2 07/19/2018   PLT 259 07/19/2018   No results found for: FERRITIN, IRON, TIBC, UIBC, IRONPCTSAT Lab Results  Component Value Date   RBC 4.46 07/19/2018   No results found for: KPAFRELGTCHN, LAMBDASER, KAPLAMBRATIO No results  found for: Kandis Cocking, IGMSERUM No results found for: Odetta Pink, SPEI   Chemistry      Component Value Date/Time   NA 144 07/19/2018 1022   NA 146 (H) 06/27/2017 1024   NA 139 07/14/2016 0910   K 4.5 07/19/2018 1022   K 4.7 06/27/2017 1024   K 4.5 07/14/2016 0910   CL 106 07/19/2018 1022   CL 105 06/27/2017 1024   CO2 27 07/19/2018 1022   CO2 31 06/27/2017 1024   CO2 27 07/14/2016 0910   BUN 16 07/19/2018 1022   BUN 16 06/27/2017 1024   BUN 12.9 07/14/2016 0910   CREATININE 1.17 07/19/2018 1022    CREATININE 1.0 06/27/2017 1024   CREATININE 0.9 07/14/2016 0910      Component Value Date/Time   CALCIUM 9.5 07/19/2018 1022   CALCIUM 9.7 06/27/2017 1024   CALCIUM 9.6 07/14/2016 0910   ALKPHOS 48 07/19/2018 1022   ALKPHOS 68 06/27/2017 1024   ALKPHOS 83 07/14/2016 0910   AST 26 07/19/2018 1022   AST 31 07/14/2016 0910   ALT 25 07/19/2018 1022   ALT 45 06/27/2017 1024   ALT 37 07/14/2016 0910   BILITOT <0.2 (L) 07/19/2018 1022   BILITOT 0.61 07/14/2016 0910       Impression and Plan:  Connor Howell is a pleasant 54 yo caucasian gentleman with a history of a stage II seminoma of the left testicle with left inguinal orchiectomy. Connor Howell then underwent adjuvant radiation therapy, completed in December 2017. Since Connor Howell has noted some fullness in the inguinal area off and on we will get a CT of the abdomen and pelvis. Connor Howell states that Connor Howell will discuss with his insurance company and see where Connor Howell can go. With previous scans his billing has been quite high.  INR is 1.6. We will have him alternate 2.5 with 5 mg PO daily and recheck INR in 1 month.  We will plan to see him for follow-up again in 6 months.  Connor Howell will contact our office with any questions or concerns. We can certainly see him sooner if needed.   Laverna Peace, NP 4/20/20218:48 AM

## 2019-12-25 ENCOUNTER — Telehealth: Payer: Self-pay | Admitting: Family

## 2019-12-25 ENCOUNTER — Telehealth: Payer: Self-pay | Admitting: *Deleted

## 2019-12-25 LAB — AFP TUMOR MARKER: AFP, Serum, Tumor Marker: 3.6 ng/mL (ref 0.0–8.3)

## 2019-12-25 NOTE — Telephone Encounter (Signed)
No los 4/20

## 2019-12-25 NOTE — Telephone Encounter (Signed)
CT AP Case# [id number] [facility] regarding location pt can have scan. After speaking w/ representative I was advised [facility] and [facility] were both out of network. Spoke with Baxter Flattery ( PA rep) who advised she will call insurance and assist pt.

## 2019-12-31 ENCOUNTER — Ambulatory Visit (HOSPITAL_BASED_OUTPATIENT_CLINIC_OR_DEPARTMENT_OTHER): Payer: 59

## 2020-05-20 ENCOUNTER — Other Ambulatory Visit: Payer: Self-pay | Admitting: Hematology & Oncology

## 2020-06-30 ENCOUNTER — Ambulatory Visit: Payer: 59 | Attending: Internal Medicine

## 2020-06-30 DIAGNOSIS — Z23 Encounter for immunization: Secondary | ICD-10-CM

## 2020-06-30 NOTE — Progress Notes (Signed)
   Covid-19 Vaccination Clinic  Name:  Connor Howell    MRN: [id number] DOB: 13-Dec-1965  06/30/2020  [name] was observed post Covid-19 immunization for 15 minutes without incident. He was provided with Vaccine Information Sheet and instruction to access the V-Safe system.   [name] was instructed to call 911 with any severe reactions post vaccine: Marland Kitchen Difficulty breathing  . Swelling of face and throat  . A fast heartbeat  . A bad rash all over body  . Dizziness and weakness

## 2020-08-14 ENCOUNTER — Telehealth: Payer: Self-pay | Admitting: Family

## 2020-08-14 NOTE — Telephone Encounter (Signed)
I called and LMVM for patient about his upcoming CT scan that has been scheduled for 12/29 @3 :40pm [facility] [place].  I also left instructions for prep for scn- NPO 4 hours prios / Clear liquids only are ok .  I left [facility] phone number should he need to cancel or reschedule this appt.

## 2020-09-02 ENCOUNTER — Ambulatory Visit
Admission: RE | Admit: 2020-09-02 | Discharge: 2020-09-02 | Disposition: A | Discharge status: Home / Self Care | Payer: 59 | Source: Ambulatory Visit | Attending: Family | Admitting: Family

## 2020-09-02 DIAGNOSIS — C6292 Malignant neoplasm of left testis, unspecified whether descended or undescended: Secondary | ICD-10-CM

## 2020-09-02 DIAGNOSIS — Z7901 Long term (current) use of anticoagulants: Secondary | ICD-10-CM

## 2020-09-02 MED ORDER — IOPAMIDOL (ISOVUE-300) INJECTION 61%
100.0000 mL | Freq: Once | INTRAVENOUS | Status: AC | PRN
  Administered 2020-09-02: 100 mL via INTRAVENOUS

## 2020-09-03 ENCOUNTER — Telehealth: Payer: Self-pay | Admitting: *Deleted

## 2020-09-03 NOTE — Telephone Encounter (Signed)
-----   Message from Verdie Mosher, NP sent at 09/03/2020  2:05 PM EST ----- CT is negative!!!! No evidence of metastatic disease!!!! WOO HOO!!!!   ----- Message ----- From: Interface, Rad Results In Sent: 09/02/2020   4:48 PM EST To: Verdie Mosher, NP

## 2020-09-03 NOTE — Telephone Encounter (Signed)
Unable to reach pt, lmovm with results. Requested pt to call with concerns.

## 2020-11-04 ENCOUNTER — Other Ambulatory Visit: Payer: Self-pay | Admitting: Hematology & Oncology

## 2021-05-03 ENCOUNTER — Other Ambulatory Visit: Payer: Self-pay | Admitting: Hematology & Oncology

## 2021-06-09 ENCOUNTER — Encounter: Payer: Self-pay | Admitting: *Deleted

## 2021-09-20 ENCOUNTER — Other Ambulatory Visit: Payer: Self-pay | Admitting: Hematology & Oncology

## 2021-11-06 ENCOUNTER — Other Ambulatory Visit: Payer: Self-pay | Admitting: Hematology & Oncology

## 2021-12-20 DIAGNOSIS — Z7901 Long term (current) use of anticoagulants: Secondary | ICD-10-CM | POA: Diagnosis not present

## 2021-12-20 DIAGNOSIS — E785 Hyperlipidemia, unspecified: Secondary | ICD-10-CM | POA: Diagnosis not present

## 2021-12-23 DIAGNOSIS — D6859 Other primary thrombophilia: Secondary | ICD-10-CM | POA: Diagnosis not present

## 2021-12-23 DIAGNOSIS — Z7901 Long term (current) use of anticoagulants: Secondary | ICD-10-CM | POA: Diagnosis not present

## 2021-12-23 DIAGNOSIS — E782 Mixed hyperlipidemia: Secondary | ICD-10-CM | POA: Diagnosis not present

## 2022-02-09 DIAGNOSIS — L02212 Cutaneous abscess of back [any part, except buttock]: Secondary | ICD-10-CM | POA: Diagnosis not present

## 2022-02-09 DIAGNOSIS — L089 Local infection of the skin and subcutaneous tissue, unspecified: Secondary | ICD-10-CM | POA: Diagnosis not present

## 2022-11-23 ENCOUNTER — Other Ambulatory Visit: Payer: Self-pay | Admitting: Hematology & Oncology

## 2022-11-29 DIAGNOSIS — E785 Hyperlipidemia, unspecified: Secondary | ICD-10-CM | POA: Diagnosis not present

## 2022-11-29 DIAGNOSIS — Z125 Encounter for screening for malignant neoplasm of prostate: Secondary | ICD-10-CM | POA: Diagnosis not present

## 2022-11-29 DIAGNOSIS — Z7901 Long term (current) use of anticoagulants: Secondary | ICD-10-CM | POA: Diagnosis not present

## 2022-12-01 DIAGNOSIS — D6859 Other primary thrombophilia: Secondary | ICD-10-CM | POA: Diagnosis not present

## 2022-12-01 DIAGNOSIS — Z6827 Body mass index (BMI) 27.0-27.9, adult: Secondary | ICD-10-CM | POA: Diagnosis not present

## 2022-12-01 DIAGNOSIS — E782 Mixed hyperlipidemia: Secondary | ICD-10-CM | POA: Diagnosis not present

## 2022-12-01 DIAGNOSIS — Z7901 Long term (current) use of anticoagulants: Secondary | ICD-10-CM | POA: Diagnosis not present

## 2023-05-25 ENCOUNTER — Other Ambulatory Visit: Payer: Self-pay | Admitting: Hematology & Oncology

## 2023-06-09 DIAGNOSIS — E782 Mixed hyperlipidemia: Secondary | ICD-10-CM | POA: Diagnosis not present

## 2023-07-24 DIAGNOSIS — Z23 Encounter for immunization: Secondary | ICD-10-CM | POA: Diagnosis not present

## 2023-07-24 DIAGNOSIS — Z131 Encounter for screening for diabetes mellitus: Secondary | ICD-10-CM | POA: Diagnosis not present

## 2023-07-24 DIAGNOSIS — E782 Mixed hyperlipidemia: Secondary | ICD-10-CM | POA: Diagnosis not present

## 2023-07-24 DIAGNOSIS — D6859 Other primary thrombophilia: Secondary | ICD-10-CM | POA: Diagnosis not present

## 2023-07-24 DIAGNOSIS — Z7901 Long term (current) use of anticoagulants: Secondary | ICD-10-CM | POA: Diagnosis not present

## 2023-07-24 DIAGNOSIS — Z0000 Encounter for general adult medical examination without abnormal findings: Secondary | ICD-10-CM | POA: Diagnosis not present

## 2023-08-25 DIAGNOSIS — R051 Acute cough: Secondary | ICD-10-CM | POA: Diagnosis not present

## 2023-08-25 DIAGNOSIS — U071 COVID-19: Secondary | ICD-10-CM | POA: Diagnosis not present

## 2023-08-25 DIAGNOSIS — J029 Acute pharyngitis, unspecified: Secondary | ICD-10-CM | POA: Diagnosis not present

## 2023-12-05 ENCOUNTER — Other Ambulatory Visit: Payer: Self-pay | Admitting: Hematology & Oncology

## 2024-01-16 DIAGNOSIS — E782 Mixed hyperlipidemia: Secondary | ICD-10-CM | POA: Diagnosis not present

## 2024-01-23 ENCOUNTER — Other Ambulatory Visit (HOSPITAL_COMMUNITY): Payer: Self-pay | Admitting: Family Medicine

## 2024-01-23 DIAGNOSIS — Z7901 Long term (current) use of anticoagulants: Secondary | ICD-10-CM | POA: Diagnosis not present

## 2024-01-23 DIAGNOSIS — E782 Mixed hyperlipidemia: Secondary | ICD-10-CM

## 2024-01-23 DIAGNOSIS — D6859 Other primary thrombophilia: Secondary | ICD-10-CM | POA: Diagnosis not present

## 2024-01-23 DIAGNOSIS — R7303 Prediabetes: Secondary | ICD-10-CM | POA: Diagnosis not present

## 2024-01-23 DIAGNOSIS — Z6827 Body mass index (BMI) 27.0-27.9, adult: Secondary | ICD-10-CM | POA: Diagnosis not present

## 2024-02-07 ENCOUNTER — Other Ambulatory Visit (HOSPITAL_COMMUNITY)

## 2024-02-07 ENCOUNTER — Encounter (HOSPITAL_COMMUNITY): Payer: Self-pay

## 2024-06-24 ENCOUNTER — Ambulatory Visit (HOSPITAL_COMMUNITY)
Admission: RE | Admit: 2024-06-24 | Discharge: 2024-06-24 | Disposition: A | Discharge status: Home / Self Care | Payer: Self-pay | Source: Ambulatory Visit | Attending: Family Medicine | Admitting: Family Medicine

## 2024-06-24 DIAGNOSIS — E782 Mixed hyperlipidemia: Secondary | ICD-10-CM | POA: Insufficient documentation

## 2024-07-24 DIAGNOSIS — E782 Mixed hyperlipidemia: Secondary | ICD-10-CM | POA: Diagnosis not present

## 2024-07-24 DIAGNOSIS — Z125 Encounter for screening for malignant neoplasm of prostate: Secondary | ICD-10-CM | POA: Diagnosis not present

## 2024-07-24 DIAGNOSIS — R7303 Prediabetes: Secondary | ICD-10-CM | POA: Diagnosis not present
